# Patient Record
Sex: Female | Born: 1975 | Race: Black or African American | Hispanic: No | Marital: Married | Smoking: Current some day smoker
Health system: Southern US, Community
[De-identification: ages and names within clinical notes are randomized; demographics above are authoritative.]

## PROBLEM LIST (undated history)

## (undated) DIAGNOSIS — K289 Gastrojejunal ulcer, unspecified as acute or chronic, without hemorrhage or perforation: Secondary | ICD-10-CM

## (undated) DIAGNOSIS — K5900 Constipation, unspecified: Secondary | ICD-10-CM

## (undated) DIAGNOSIS — D573 Sickle-cell trait: Secondary | ICD-10-CM

## (undated) HISTORY — PX: TUBAL LIGATION: SHX77

## (undated) HISTORY — PX: NOVASURE ABLATION: SHX5394

---

## 2014-02-08 ENCOUNTER — Emergency Department (HOSPITAL_BASED_OUTPATIENT_CLINIC_OR_DEPARTMENT_OTHER)
Admission: EM | Admit: 2014-02-08 | Discharge: 2014-02-08 | Disposition: A | Discharge status: Home / Self Care | Payer: Self-pay | Attending: Emergency Medicine | Admitting: Emergency Medicine

## 2014-02-08 ENCOUNTER — Encounter (HOSPITAL_BASED_OUTPATIENT_CLINIC_OR_DEPARTMENT_OTHER): Payer: Self-pay | Admitting: Emergency Medicine

## 2014-02-08 DIAGNOSIS — R1110 Vomiting, unspecified: Secondary | ICD-10-CM | POA: Insufficient documentation

## 2014-02-08 DIAGNOSIS — Z3202 Encounter for pregnancy test, result negative: Secondary | ICD-10-CM | POA: Insufficient documentation

## 2014-02-08 DIAGNOSIS — Z872 Personal history of diseases of the skin and subcutaneous tissue: Secondary | ICD-10-CM | POA: Insufficient documentation

## 2014-02-08 DIAGNOSIS — R109 Unspecified abdominal pain: Secondary | ICD-10-CM

## 2014-02-08 DIAGNOSIS — R1013 Epigastric pain: Secondary | ICD-10-CM | POA: Insufficient documentation

## 2014-02-08 DIAGNOSIS — Z880 Allergy status to penicillin: Secondary | ICD-10-CM | POA: Insufficient documentation

## 2014-02-08 LAB — COMPREHENSIVE METABOLIC PANEL
ALBUMIN: 5 g/dL (ref 3.5–5.2)
ALK PHOS: 76 U/L (ref 39–117)
ALT: 10 U/L (ref 0–35)
AST: 19 U/L (ref 0–37)
BILIRUBIN TOTAL: 0.7 mg/dL (ref 0.3–1.2)
BUN: 9 mg/dL (ref 6–23)
CO2: 22 meq/L (ref 19–32)
CREATININE: 0.7 mg/dL (ref 0.50–1.10)
Calcium: 10.9 mg/dL — ABNORMAL HIGH (ref 8.4–10.5)
Chloride: 100 mEq/L (ref 96–112)
GFR calc Af Amer: >90 mL/min (ref >90)
GLUCOSE: 119 mg/dL — AB (ref 70–99)
POTASSIUM: 3.6 meq/L — AB (ref 3.7–5.3)
Sodium: 141 mEq/L (ref 137–147)
Total Protein: 9.4 g/dL — ABNORMAL HIGH (ref 6.0–8.3)

## 2014-02-08 LAB — CBC WITH DIFFERENTIAL/PLATELET
BASOS ABS: 0 10*3/uL (ref 0.0–0.1)
Basophils Relative: 0 % (ref 0–1)
Eosinophils Absolute: 0 10*3/uL (ref 0.0–0.7)
Eosinophils Relative: 0 % (ref 0–5)
HCT: 41.8 % (ref 36.0–46.0)
Hemoglobin: 15 g/dL (ref 12.0–15.0)
LYMPHS ABS: 1.6 10*3/uL (ref 0.7–4.0)
LYMPHS PCT: 11 % — AB (ref 12–46)
MCH: 32.1 pg (ref 26.0–34.0)
MCHC: 35.9 g/dL (ref 30.0–36.0)
MCV: 89.3 fL (ref 78.0–100.0)
Monocytes Absolute: 0.6 10*3/uL (ref 0.1–1.0)
Monocytes Relative: 4 % (ref 3–12)
Neutro Abs: 12.1 10*3/uL — ABNORMAL HIGH (ref 1.7–7.7)
Neutrophils Relative %: 85 % — ABNORMAL HIGH (ref 43–77)
PLATELETS: 430 10*3/uL — AB (ref 150–400)
RBC: 4.68 MIL/uL (ref 3.87–5.11)
RDW: 12.3 % (ref 11.5–15.5)
WBC: 14.3 10*3/uL — AB (ref 4.0–10.5)

## 2014-02-08 LAB — URINALYSIS, ROUTINE W REFLEX MICROSCOPIC
BILIRUBIN URINE: NEGATIVE
Glucose, UA: NEGATIVE mg/dL
HGB URINE DIPSTICK: NEGATIVE
Ketones, ur: >80 mg/dL — AB
LEUKOCYTES UA: NEGATIVE
Nitrite: NEGATIVE
PH: 8.5 — AB (ref 5.0–8.0)
PROTEIN: 30 mg/dL — AB
Specific Gravity, Urine: 1.015 (ref 1.005–1.030)
Urobilinogen, UA: 0.2 mg/dL (ref 0.0–1.0)

## 2014-02-08 LAB — LIPASE, BLOOD: LIPASE: 24 U/L (ref 11–59)

## 2014-02-08 LAB — URINE MICROSCOPIC-ADD ON

## 2014-02-08 LAB — PREGNANCY, URINE: Preg Test, Ur: NEGATIVE

## 2014-02-08 MED ORDER — ONDANSETRON HCL 4 MG/2ML IJ SOLN
4.0000 mg | Freq: Once | INTRAMUSCULAR | Status: AC
  Administered 2014-02-08: 4 mg via INTRAVENOUS
  Filled 2014-02-08: qty 2

## 2014-02-08 MED ORDER — PROMETHAZINE HCL 25 MG/ML IJ SOLN
25.0000 mg | Freq: Once | INTRAMUSCULAR | Status: AC
  Administered 2014-02-08: 25 mg via INTRAVENOUS
  Filled 2014-02-08: qty 1

## 2014-02-08 MED ORDER — ONDANSETRON 4 MG PO TBDP: 4.0000 mg | ORAL_TABLET | Freq: Three times a day (TID) | ORAL | Status: DC | PRN

## 2014-02-08 MED ORDER — PANTOPRAZOLE SODIUM 40 MG IV SOLR
40.0000 mg | Freq: Once | INTRAVENOUS | Status: AC
  Administered 2014-02-08: 40 mg via INTRAVENOUS
  Filled 2014-02-08: qty 40

## 2014-02-08 MED ORDER — MORPHINE SULFATE 4 MG/ML IJ SOLN
4.0000 mg | Freq: Once | INTRAMUSCULAR | Status: AC
  Administered 2014-02-08: 4 mg via INTRAVENOUS
  Filled 2014-02-08: qty 1

## 2014-02-08 MED ORDER — SODIUM CHLORIDE 0.9 % IV BOLUS (SEPSIS)
1000.0000 mL | Freq: Once | INTRAVENOUS | Status: AC
  Administered 2014-02-08: 1000 mL via INTRAVENOUS

## 2014-02-08 MED ORDER — HYDROCODONE-ACETAMINOPHEN 5-325 MG PO TABS: 1.0000 | ORAL_TABLET | ORAL | Status: DC | PRN

## 2014-02-08 MED ORDER — PROMETHAZINE HCL 25 MG PO TABS: 25.0000 mg | ORAL_TABLET | Freq: Four times a day (QID) | ORAL | Status: DC | PRN

## 2014-02-08 NOTE — Discharge Instructions (Signed)

## 2014-02-08 NOTE — ED Provider Notes (Signed)
CSN: [id number]     Arrival date & time 02/08/14  1407 History   First MD Initiated Contact with Patient 02/08/14 1429     Chief Complaint  Patient presents with  . Abdominal Pain     (Consider location/radiation/quality/duration/timing/severity/associated sxs/prior Treatment) HPI Comments: Pt c/o epigastric abdominal pain and vomiting since last night. Pt denies fever or diarrhea. Has taken prevacid for her ulcer. Pt states that she has done this before recently and had  A ct scan and they didn't find anything.  The history is provided by the patient. No language interpreter was used.    History reviewed. No pertinent past medical history. History reviewed. No pertinent past surgical history. No family history on file. History  Substance Use Topics  . Smoking status: Never Smoker   . Smokeless tobacco: Not on file  . Alcohol Use: No   OB History   Grav Para Term Preterm Abortions TAB SAB Ect Mult Living                 Review of Systems  Constitutional: Negative.   Respiratory: Negative.   Cardiovascular: Negative.       Allergies  Penicillins  Home Medications  No current outpatient prescriptions on file. BP 131/80  Pulse 73  Temp(Src) 98.1 F (36.7 C) (Oral)  Resp 20  Ht 5\' 5"  (1.651 m)  Wt 164 lb (74.39 kg)  BMI 27.29 kg/m2  SpO2 100%  LMP 01/23/2014 Physical Exam  Nursing note and vitals reviewed. Constitutional: She is oriented to person, place, and time. She appears well-developed and well-nourished.  Cardiovascular: Normal rate and regular rhythm.   Pulmonary/Chest: Effort normal and breath sounds normal.  Abdominal: Soft. Bowel sounds are normal. There is no tenderness.  Musculoskeletal: Normal range of motion.  Neurological: She is alert and oriented to person, place, and time.  Skin: Skin is warm and dry.  Psychiatric: She has a normal mood and affect.    ED Course  Procedures (including critical care time) Labs Review Labs Reviewed   URINALYSIS, ROUTINE W REFLEX MICROSCOPIC - Abnormal; Notable for the following:    pH 8.5 (*)    Ketones, ur >80 (*)    Protein, ur 30 (*)    All other components within normal limits  COMPREHENSIVE METABOLIC PANEL - Abnormal; Notable for the following:    Potassium 3.6 (*)    Glucose, Bld 119 (*)    Calcium 10.9 (*)    Total Protein 9.4 (*)    All other components within normal limits  CBC WITH DIFFERENTIAL - Abnormal; Notable for the following:    WBC 14.3 (*)    Platelets 430 (*)    Neutrophils Relative % 85 (*)    Lymphocytes Relative 11 (*)    Neutro Abs 12.1 (*)    All other components within normal limits  PREGNANCY, URINE  LIPASE, BLOOD  URINE MICROSCOPIC-ADD ON   Imaging Review No results found.   EKG Interpretation None      MDM   Final diagnoses:  Abdominal pain  Vomiting    Likely gastritis. Don't think any imaging is needed at this time:pt is okay to tolerate po at this time:will send home with antiemetic and hydrocodone    Glendell Docker, NP 02/08/14 1638

## 2014-02-08 NOTE — ED Notes (Signed)
Abdominal pain and vomiting since last night.

## 2014-02-09 NOTE — ED Provider Notes (Signed)
Medical screening examination/treatment/procedure(s) were performed by non-physician practitioner and as supervising physician I was immediately available for consultation/collaboration.  Leota Jacobsen, MD 02/09/14 [phone]

## 2015-01-19 ENCOUNTER — Encounter (HOSPITAL_COMMUNITY): Payer: Self-pay | Admitting: Emergency Medicine

## 2015-01-19 ENCOUNTER — Emergency Department (HOSPITAL_COMMUNITY): Payer: Self-pay

## 2015-01-19 ENCOUNTER — Emergency Department (HOSPITAL_COMMUNITY)
Admission: EM | Admit: 2015-01-19 | Discharge: 2015-01-20 | Disposition: A | Discharge status: Home / Self Care | Payer: Self-pay | Attending: Emergency Medicine | Admitting: Emergency Medicine

## 2015-01-19 DIAGNOSIS — M545 Low back pain: Secondary | ICD-10-CM | POA: Insufficient documentation

## 2015-01-19 DIAGNOSIS — Z880 Allergy status to penicillin: Secondary | ICD-10-CM | POA: Insufficient documentation

## 2015-01-19 DIAGNOSIS — R630 Anorexia: Secondary | ICD-10-CM | POA: Insufficient documentation

## 2015-01-19 DIAGNOSIS — Z79899 Other long term (current) drug therapy: Secondary | ICD-10-CM | POA: Insufficient documentation

## 2015-01-19 DIAGNOSIS — R112 Nausea with vomiting, unspecified: Secondary | ICD-10-CM

## 2015-01-19 DIAGNOSIS — Z3202 Encounter for pregnancy test, result negative: Secondary | ICD-10-CM | POA: Insufficient documentation

## 2015-01-19 DIAGNOSIS — K5900 Constipation, unspecified: Secondary | ICD-10-CM | POA: Insufficient documentation

## 2015-01-19 DIAGNOSIS — R109 Unspecified abdominal pain: Secondary | ICD-10-CM

## 2015-01-19 DIAGNOSIS — R509 Fever, unspecified: Secondary | ICD-10-CM | POA: Insufficient documentation

## 2015-01-19 DIAGNOSIS — R1084 Generalized abdominal pain: Secondary | ICD-10-CM

## 2015-01-19 DIAGNOSIS — M791 Myalgia: Secondary | ICD-10-CM | POA: Insufficient documentation

## 2015-01-19 LAB — CBC WITH DIFFERENTIAL/PLATELET
BASOS ABS: 0 10*3/uL (ref 0.0–0.1)
Basophils Relative: 0 % (ref 0–1)
EOS ABS: 0 10*3/uL (ref 0.0–0.7)
EOS PCT: 0 % (ref 0–5)
HCT: 43.7 % (ref 36.0–46.0)
Hemoglobin: 15.1 g/dL — ABNORMAL HIGH (ref 12.0–15.0)
Lymphocytes Relative: 12 % (ref 12–46)
Lymphs Abs: 1.3 10*3/uL (ref 0.7–4.0)
MCH: 31.4 pg (ref 26.0–34.0)
MCHC: 34.6 g/dL (ref 30.0–36.0)
MCV: 90.9 fL (ref 78.0–100.0)
Monocytes Absolute: 0.4 10*3/uL (ref 0.1–1.0)
Monocytes Relative: 4 % (ref 3–12)
NEUTROS PCT: 84 % — AB (ref 43–77)
Neutro Abs: 9.5 10*3/uL — ABNORMAL HIGH (ref 1.7–7.7)
PLATELETS: 361 10*3/uL (ref 150–400)
RBC: 4.81 MIL/uL (ref 3.87–5.11)
RDW: 13.3 % (ref 11.5–15.5)
WBC: 11.3 10*3/uL — AB (ref 4.0–10.5)

## 2015-01-19 LAB — URINALYSIS, ROUTINE W REFLEX MICROSCOPIC
Bilirubin Urine: NEGATIVE
GLUCOSE, UA: NEGATIVE mg/dL
Hgb urine dipstick: NEGATIVE
KETONES UR: 40 mg/dL — AB
Leukocytes, UA: NEGATIVE
NITRITE: NEGATIVE
PH: 8.5 — AB (ref 5.0–8.0)
Protein, ur: 30 mg/dL — AB
SPECIFIC GRAVITY, URINE: 1.02 (ref 1.005–1.030)
Urobilinogen, UA: 0.2 mg/dL (ref 0.0–1.0)

## 2015-01-19 LAB — COMPREHENSIVE METABOLIC PANEL
ALT: 14 U/L (ref 0–35)
AST: 21 U/L (ref 0–37)
Albumin: 5.2 g/dL (ref 3.5–5.2)
Alkaline Phosphatase: 67 U/L (ref 39–117)
Anion gap: 12 (ref 5–15)
BILIRUBIN TOTAL: 1 mg/dL (ref 0.3–1.2)
BUN: 11 mg/dL (ref 6–23)
CALCIUM: 10.5 mg/dL (ref 8.4–10.5)
CHLORIDE: 105 mmol/L (ref 96–112)
CO2: 26 mmol/L (ref 19–32)
Creatinine, Ser: 0.76 mg/dL (ref 0.50–1.10)
GFR calc non Af Amer: >90 mL/min (ref >90)
GLUCOSE: 124 mg/dL — AB (ref 70–99)
Potassium: 3.6 mmol/L (ref 3.5–5.1)
Sodium: 143 mmol/L (ref 135–145)
Total Protein: 9.3 g/dL — ABNORMAL HIGH (ref 6.0–8.3)

## 2015-01-19 LAB — URINE MICROSCOPIC-ADD ON

## 2015-01-19 LAB — LIPASE, BLOOD: LIPASE: 20 U/L (ref 11–59)

## 2015-01-19 LAB — POC URINE PREG, ED: Preg Test, Ur: NEGATIVE

## 2015-01-19 MED ORDER — MORPHINE SULFATE 4 MG/ML IJ SOLN
4.0000 mg | Freq: Once | INTRAMUSCULAR | Status: AC
  Administered 2015-01-19: 4 mg via INTRAVENOUS
  Filled 2015-01-19: qty 1

## 2015-01-19 MED ORDER — DICYCLOMINE HCL 10 MG/ML IM SOLN
20.0000 mg | Freq: Once | INTRAMUSCULAR | Status: AC
  Administered 2015-01-19: 20 mg via INTRAMUSCULAR
  Filled 2015-01-19: qty 2

## 2015-01-19 MED ORDER — MAGNESIUM CITRATE PO SOLN
1.0000 | Freq: Once | ORAL | Status: AC
  Administered 2015-01-19: 1 via ORAL
  Filled 2015-01-19: qty 296

## 2015-01-19 MED ORDER — SODIUM CHLORIDE 0.9 % IV BOLUS (SEPSIS)
1000.0000 mL | Freq: Once | INTRAVENOUS | Status: AC
  Administered 2015-01-19: 1000 mL via INTRAVENOUS

## 2015-01-19 MED ORDER — ONDANSETRON HCL 4 MG/2ML IJ SOLN
4.0000 mg | Freq: Once | INTRAMUSCULAR | Status: AC
  Administered 2015-01-19: 4 mg via INTRAVENOUS
  Filled 2015-01-19: qty 2

## 2015-01-19 MED ORDER — FLEET ENEMA 7-19 GM/118ML RE ENEM
1.0000 | ENEMA | Freq: Once | RECTAL | Status: AC
  Administered 2015-01-19: 1 via RECTAL
  Filled 2015-01-19: qty 1

## 2015-01-19 MED ORDER — PROMETHAZINE HCL 25 MG/ML IJ SOLN
25.0000 mg | Freq: Once | INTRAMUSCULAR | Status: AC
  Administered 2015-01-19: 25 mg via INTRAVENOUS
  Filled 2015-01-19 (×2): qty 1

## 2015-01-19 MED ORDER — PROMETHAZINE HCL 25 MG/ML IJ SOLN
25.0000 mg | Freq: Once | INTRAMUSCULAR | Status: AC
  Administered 2015-01-19: 25 mg via INTRAVENOUS
  Filled 2015-01-19: qty 1

## 2015-01-19 NOTE — ED Notes (Signed)
Pt on bedside commode, states she continues to feel like she has to have a BM but is unable to go.

## 2015-01-19 NOTE — ED Notes (Signed)
Pt has been vomiting all day, not able to keep anything dow, has not has BM in about 3 days. Pt c/o abd pain

## 2015-01-19 NOTE — ED Provider Notes (Signed)
CSN: [id number]     Arrival date & time 01/19/15  1815 History   First MD Initiated Contact with Patient 01/19/15 1844     Chief Complaint  Patient presents with  . Vomiting     (Consider location/radiation/quality/duration/timing/severity/associated sxs/prior Treatment) HPI Pt is a 39yo female with hx of renal stones and constipation, presenting to ED with c/o gradually worsening abdominal pain with associated nausea and vomiting.  Reports having 15-20 episodes of vomiting with scant red blood in emesis.  Abdominal pain is 10/10, nothing makes better or worse. States she has not had a BM for 3 days.  Reports taking miralax w/o relief.  Reports having an EGD a few months ago and was told there was "scar tissue" but was only told to take miralax.  Denies hx of SBO but concerned she is "backed up". Abdominal hx significant for c-section. Denies urinary or vaginal symptoms.   History reviewed. No pertinent past medical history. History reviewed. No pertinent past surgical history. No family history on file. History  Substance Use Topics  . Smoking status: Never Smoker   . Smokeless tobacco: Not on file  . Alcohol Use: No   OB History    No data available     Review of Systems  Constitutional: Positive for fever, chills and appetite change. Negative for diaphoresis and fatigue.  Respiratory: Negative for cough and shortness of breath.   Cardiovascular: Negative for chest pain and palpitations.  Gastrointestinal: Positive for nausea, vomiting, abdominal pain ( diffuse) and constipation. Negative for diarrhea.  Genitourinary: Positive for flank pain ( bilateral). Negative for dysuria, urgency, frequency, hematuria, decreased urine volume, vaginal bleeding, vaginal discharge, vaginal pain and pelvic pain.  Musculoskeletal: Positive for myalgias and back pain ( bilateral lower). Negative for neck pain and neck stiffness.  All other systems reviewed and are negative.     Allergies   Penicillins  Home Medications   Prior to Admission medications   Medication Sig Start Date End Date Taking? Authorizing Provider  dicyclomine (BENTYL) 20 MG tablet Take 1 tablet (20 mg total) by mouth 2 (two) times daily. 01/20/15   Noland Fordyce, PA-C  HYDROcodone-acetaminophen (NORCO/VICODIN) 5-325 MG per tablet Take 1-2 tablets by mouth every 4 (four) hours as needed. Patient not taking: Reported on 01/19/2015 02/08/14   Glendell Docker, NP  ondansetron (ZOFRAN ODT) 4 MG disintegrating tablet Take 1 tablet (4 mg total) by mouth every 8 (eight) hours as needed for nausea or vomiting. Patient not taking: Reported on 01/19/2015 02/08/14   Glendell Docker, NP  promethazine (PHENERGAN) 25 MG tablet Take 1 tablet (25 mg total) by mouth every 6 (six) hours as needed for nausea or vomiting. Patient not taking: Reported on 01/19/2015 02/08/14   Glendell Docker, NP  promethazine (PHENERGAN) 25 MG tablet Take 1 tablet (25 mg total) by mouth every 6 (six) hours as needed for nausea or vomiting. 01/20/15   Noland Fordyce, PA-C   BP 131/76 mmHg  Pulse 65  Temp(Src) 97.8 F (36.6 C) (Oral)  Resp 18  SpO2 100%  LMP 12/18/2014 (Approximate) Physical Exam  Constitutional: She appears well-developed and well-nourished. She appears distressed.  Pt lying in exam bed on left side, curled up under blankets, tearful, active vomiting during exam.  HENT:  Head: Normocephalic and atraumatic.  Eyes: Conjunctivae are normal. No scleral icterus.  Neck: Normal range of motion.  Cardiovascular: Normal rate, regular rhythm and normal heart sounds.   Pulmonary/Chest: Effort normal and breath sounds normal. No respiratory distress.  She has no wheezes. She has no rales. She exhibits no tenderness.  Abdominal: Soft. Bowel sounds are normal. She exhibits no distension and no mass. There is tenderness. There is no rebound and no guarding.  Soft, non-distended. diffuse tenderness w/o guarding. Bilateral CVAT vs muscular  pain.  Genitourinary:  Chaperoned exam. Rectal exam: no external or internal hemorrhoids. Normal sphincter tone. No fecal impaction.   Musculoskeletal: Normal range of motion.  Neurological: She is alert.  Skin: Skin is warm and dry. She is not diaphoretic.  Nursing note and vitals reviewed.   ED Course  Procedures (including critical care time) Labs Review Labs Reviewed  CBC WITH DIFFERENTIAL/PLATELET - Abnormal; Notable for the following:    WBC 11.3 (*)    Hemoglobin 15.1 (*)    Neutrophils Relative % 84 (*)    Neutro Abs 9.5 (*)    All other components within normal limits  COMPREHENSIVE METABOLIC PANEL - Abnormal; Notable for the following:    Glucose, Bld 124 (*)    Total Protein 9.3 (*)    All other components within normal limits  URINALYSIS, ROUTINE W REFLEX MICROSCOPIC - Abnormal; Notable for the following:    pH 8.5 (*)    Ketones, ur 40 (*)    Protein, ur 30 (*)    All other components within normal limits  LIPASE, BLOOD  URINE MICROSCOPIC-ADD ON  POC URINE PREG, ED    Imaging Review Ct Renal Stone Study  01/19/2015   CLINICAL DATA:  Dysuria, low abdominal pain, nausea and vomiting.  EXAM: CT ABDOMEN AND PELVIS WITHOUT CONTRAST  TECHNIQUE: Multidetector CT imaging of the abdomen and pelvis was performed following the standard protocol without IV contrast.  COMPARISON:  10/30/2014  FINDINGS: The lung bases are clear.  Kidneys are symmetrical. No hydronephrosis or hydroureter. No renal, ureteral, or bladder stones identified. No bladder wall thickening.  Unenhanced appearance of the liver, spleen, gallbladder, pancreas, adrenal glands, abdominal aorta, inferior vena cava, and retroperitoneal lymph nodes is unremarkable. Stomach, small bowel, and colon are decompressed. No free air or free fluid in the abdomen.  Pelvis: The appendix is normal. Uterus and ovaries are not enlarged. Calcified phleboliths. Gas and fullness in the vagina, possible tampon. No pelvic mass or  lymphadenopathy. No free or loculated pelvic fluid collections. No destructive bone lesions.  IMPRESSION: No renal or ureteral stone or obstruction.   Electronically Signed   By: Lucienne Capers M.D.   On: 01/19/2015 21:30     EKG Interpretation None      MDM   Final diagnoses:  Generalized abdominal pain  Abdominal cramping  Constipation, unspecified constipation type  Non-intractable vomiting with nausea, vomiting of unspecified type    Pt with hx of renal stones and constipation presenting to ED with severe abdominal pain, nausea and vomiting.   Pt denies urinary symptoms but c/o bilateral flank pain, lower back pain and diffuse abdominal pain. Unable to keep down fluids. No BM for 3 days.  Abdomen is soft, non-distended with diffuse tenderness and bilateral CVAT vs muscular pain.  No fecal impaction.  Labs: UA-protein and ketones present, otherwise, unremarkable.  CT abd: no renal or ureteral stone or obstruction. No free air or free fluid in abdomen.  Will tx for constipation. FLEET enema ordered in ED.   10:01 PM Discussed labs and imaging with pt. Pt sitting on exam bed with knees to her chest, tremoring, appears uncomfortable.  FLEET enema still pending, pt states "give me whatever will make  me go (have a BM) now."   11:32 PM Pt has only had 1 small BM. States she has only tried 1 sip of magnesium citrate as it made her vomit.  Will give a second dose of phenergan.  Discussed pt with Dr. Maryan Rued, will give pt IM bentyl, believe pt has IBS as she has recurrent similar episodes every week.  12:20 AM Pt resting comfortably in exam bed. States she feels comfortable being discharged home, would like to be discharged so she can sleep at home in her bed.  Pt hemodynamically stable for discharge home. Rx: bentyl and phenergan. Home care instructions provided. Advised to f/u with PCP and GI for recheck of symptoms next week. Return precautions provided. Pt verbalized understanding and  agreement with tx plan.     Noland Fordyce, PA-C 01/20/15 0022  Blanchie Dessert, MD 01/20/15 [phone]

## 2015-01-19 NOTE — ED Notes (Signed)
Pt unable to give urine sample at this time 

## 2015-01-20 MED ORDER — PROMETHAZINE HCL 25 MG PO TABS: 25.0000 mg | ORAL_TABLET | Freq: Four times a day (QID) | ORAL | Status: DC | PRN

## 2015-01-20 MED ORDER — DICYCLOMINE HCL 20 MG PO TABS: 20.0000 mg | ORAL_TABLET | Freq: Two times a day (BID) | ORAL | Status: DC

## 2015-04-01 ENCOUNTER — Emergency Department (HOSPITAL_BASED_OUTPATIENT_CLINIC_OR_DEPARTMENT_OTHER): Payer: Self-pay

## 2015-04-01 ENCOUNTER — Emergency Department (HOSPITAL_BASED_OUTPATIENT_CLINIC_OR_DEPARTMENT_OTHER)
Admission: EM | Admit: 2015-04-01 | Discharge: 2015-04-01 | Disposition: A | Discharge status: Home / Self Care | Payer: Self-pay | Attending: Emergency Medicine | Admitting: Emergency Medicine

## 2015-04-01 ENCOUNTER — Encounter (HOSPITAL_BASED_OUTPATIENT_CLINIC_OR_DEPARTMENT_OTHER): Payer: Self-pay

## 2015-04-01 DIAGNOSIS — R112 Nausea with vomiting, unspecified: Secondary | ICD-10-CM | POA: Insufficient documentation

## 2015-04-01 DIAGNOSIS — Z3202 Encounter for pregnancy test, result negative: Secondary | ICD-10-CM | POA: Insufficient documentation

## 2015-04-01 DIAGNOSIS — Z79899 Other long term (current) drug therapy: Secondary | ICD-10-CM | POA: Insufficient documentation

## 2015-04-01 DIAGNOSIS — Z880 Allergy status to penicillin: Secondary | ICD-10-CM | POA: Insufficient documentation

## 2015-04-01 DIAGNOSIS — Z8711 Personal history of peptic ulcer disease: Secondary | ICD-10-CM | POA: Insufficient documentation

## 2015-04-01 DIAGNOSIS — R1013 Epigastric pain: Secondary | ICD-10-CM | POA: Insufficient documentation

## 2015-04-01 HISTORY — DX: Gastrojejunal ulcer, unspecified as acute or chronic, without hemorrhage or perforation: K28.9

## 2015-04-01 LAB — COMPREHENSIVE METABOLIC PANEL
ALT: 11 U/L — AB (ref 14–54)
AST: 16 U/L (ref 15–41)
Albumin: 4.3 g/dL (ref 3.5–5.0)
Alkaline Phosphatase: 54 U/L (ref 38–126)
Anion gap: 10 (ref 5–15)
BILIRUBIN TOTAL: 0.6 mg/dL (ref 0.3–1.2)
BUN: 10 mg/dL (ref 6–20)
CHLORIDE: 104 mmol/L (ref 101–111)
CO2: 26 mmol/L (ref 22–32)
Calcium: 9.4 mg/dL (ref 8.9–10.3)
Creatinine, Ser: 0.83 mg/dL (ref 0.44–1.00)
GLUCOSE: 115 mg/dL — AB (ref 70–99)
POTASSIUM: 3.6 mmol/L (ref 3.5–5.1)
SODIUM: 140 mmol/L (ref 135–145)
Total Protein: 7.5 g/dL (ref 6.5–8.1)

## 2015-04-01 LAB — CBC WITH DIFFERENTIAL/PLATELET
BASOS ABS: 0 10*3/uL (ref 0.0–0.1)
BASOS PCT: 0 % (ref 0–1)
Eosinophils Absolute: 0 10*3/uL (ref 0.0–0.7)
Eosinophils Relative: 0 % (ref 0–5)
HCT: 38.2 % (ref 36.0–46.0)
Hemoglobin: 13.5 g/dL (ref 12.0–15.0)
LYMPHS PCT: 13 % (ref 12–46)
Lymphs Abs: 1.3 10*3/uL (ref 0.7–4.0)
MCH: 31.6 pg (ref 26.0–34.0)
MCHC: 35.3 g/dL (ref 30.0–36.0)
MCV: 89.5 fL (ref 78.0–100.0)
MONO ABS: 0.7 10*3/uL (ref 0.1–1.0)
Monocytes Relative: 7 % (ref 3–12)
NEUTROS ABS: 8.2 10*3/uL — AB (ref 1.7–7.7)
Neutrophils Relative %: 80 % — ABNORMAL HIGH (ref 43–77)
Platelets: 373 10*3/uL (ref 150–400)
RBC: 4.27 MIL/uL (ref 3.87–5.11)
RDW: 13.1 % (ref 11.5–15.5)
WBC: 10.1 10*3/uL (ref 4.0–10.5)

## 2015-04-01 LAB — URINALYSIS, ROUTINE W REFLEX MICROSCOPIC
Bilirubin Urine: NEGATIVE
GLUCOSE, UA: NEGATIVE mg/dL
Hgb urine dipstick: NEGATIVE
KETONES UR: 15 mg/dL — AB
Leukocytes, UA: NEGATIVE
Nitrite: NEGATIVE
PH: 8 (ref 5.0–8.0)
Protein, ur: NEGATIVE mg/dL
Specific Gravity, Urine: 1.012 (ref 1.005–1.030)
Urobilinogen, UA: 0.2 mg/dL (ref 0.0–1.0)

## 2015-04-01 LAB — TROPONIN I: Troponin I: <0.03 ng/mL (ref <0.031)

## 2015-04-01 LAB — LIPASE, BLOOD: LIPASE: 32 U/L (ref 22–51)

## 2015-04-01 LAB — PREGNANCY, URINE: Preg Test, Ur: NEGATIVE

## 2015-04-01 MED ORDER — PROMETHAZINE HCL 25 MG PO TABS: 25.0000 mg | ORAL_TABLET | Freq: Four times a day (QID) | ORAL | Status: DC | PRN

## 2015-04-01 MED ORDER — GI COCKTAIL ~~LOC~~
30.0000 mL | Freq: Once | ORAL | Status: AC
  Administered 2015-04-01: 30 mL via ORAL
  Filled 2015-04-01: qty 30

## 2015-04-01 MED ORDER — ONDANSETRON HCL 4 MG/2ML IJ SOLN
4.0000 mg | Freq: Once | INTRAMUSCULAR | Status: AC
  Administered 2015-04-01: 4 mg via INTRAVENOUS
  Filled 2015-04-01: qty 2

## 2015-04-01 MED ORDER — SODIUM CHLORIDE 0.9 % IV BOLUS (SEPSIS)
1000.0000 mL | Freq: Once | INTRAVENOUS | Status: AC
  Administered 2015-04-01: 1000 mL via INTRAVENOUS

## 2015-04-01 MED ORDER — HYDROMORPHONE HCL 1 MG/ML IJ SOLN
1.0000 mg | Freq: Once | INTRAMUSCULAR | Status: AC
  Administered 2015-04-01: 1 mg via INTRAVENOUS
  Filled 2015-04-01: qty 1

## 2015-04-01 MED ORDER — PANTOPRAZOLE SODIUM 40 MG PO TBEC
40.0000 mg | DELAYED_RELEASE_TABLET | Freq: Once | ORAL | Status: AC
  Administered 2015-04-01: 40 mg via ORAL
  Filled 2015-04-01: qty 1

## 2015-04-01 MED ORDER — OMEPRAZOLE 40 MG PO CPDR: 40.0000 mg | DELAYED_RELEASE_CAPSULE | Freq: Every day | ORAL | Status: DC

## 2015-04-01 MED ORDER — SUCRALFATE 1 G PO TABS: 1.0000 g | ORAL_TABLET | Freq: Three times a day (TID) | ORAL | Status: DC

## 2015-04-01 MED ORDER — ONDANSETRON HCL 4 MG/2ML IJ SOLN: 4.0000 mg | Freq: Once | INTRAMUSCULAR | Status: AC

## 2015-04-01 MED ORDER — IOHEXOL 300 MG/ML  SOLN
100.0000 mL | Freq: Once | INTRAMUSCULAR | Status: AC | PRN
  Administered 2015-04-01: 100 mL via INTRAVENOUS

## 2015-04-01 MED ORDER — SODIUM CHLORIDE 0.9 % IV SOLN
INTRAVENOUS | Status: DC
  Administered 2015-04-01: 14:00:00 via INTRAVENOUS

## 2015-04-01 MED ORDER — HYDROCODONE-ACETAMINOPHEN 5-325 MG PO TABS: 1.0000 | ORAL_TABLET | ORAL | Status: DC | PRN

## 2015-04-01 MED ORDER — IOHEXOL 300 MG/ML  SOLN
50.0000 mL | Freq: Once | INTRAMUSCULAR | Status: AC | PRN
  Administered 2015-04-01: 50 mL via ORAL

## 2015-04-01 MED ORDER — PROMETHAZINE HCL 25 MG/ML IJ SOLN
25.0000 mg | Freq: Once | INTRAMUSCULAR | Status: AC
  Administered 2015-04-01: 25 mg via INTRAVENOUS
  Filled 2015-04-01: qty 1

## 2015-04-01 MED ORDER — ONDANSETRON HCL 4 MG/2ML IJ SOLN
4.0000 mg | Freq: Once | INTRAMUSCULAR | Status: AC
  Administered 2015-04-01: 4 mg via INTRAVENOUS

## 2015-04-01 MED ORDER — ONDANSETRON HCL 4 MG/2ML IJ SOLN
INTRAMUSCULAR | Status: AC
  Filled 2015-04-01: qty 2

## 2015-04-01 NOTE — Discharge Instructions (Signed)
Please continue your Prilosec every day. We have given you refill for this medication. Please make an appointment to see her gastroenterologist. Please avoid NSAIDs such as ibuprofen, aspirin, Aleve. Please also avoid alcohol.   Gastritis, Adult Gastritis is soreness and swelling (inflammation) of the lining of the stomach. Gastritis can develop as a sudden onset (acute) or long-term (chronic) condition. If gastritis is not treated, it can lead to stomach bleeding and ulcers. CAUSES  Gastritis occurs when the stomach lining is weak or damaged. Digestive juices from the stomach then inflame the weakened stomach lining. The stomach lining may be weak or damaged due to viral or bacterial infections. One common bacterial infection is the Helicobacter pylori infection. Gastritis can also result from excessive alcohol consumption, taking certain medicines, or having too much acid in the stomach.  SYMPTOMS  In some cases, there are no symptoms. When symptoms are present, they may include:  Pain or a burning sensation in the upper abdomen.  Nausea.  Vomiting.  An uncomfortable feeling of fullness after eating. DIAGNOSIS  Your caregiver may suspect you have gastritis based on your symptoms and a physical exam. To determine the cause of your gastritis, your caregiver may perform the following:  Blood or stool tests to check for the H pylori bacterium.  Gastroscopy. A thin, flexible tube (endoscope) is passed down the esophagus and into the stomach. The endoscope has a light and camera on the end. Your caregiver uses the endoscope to view the inside of the stomach.  Taking a tissue sample (biopsy) from the stomach to examine under a microscope. TREATMENT  Depending on the cause of your gastritis, medicines may be prescribed. If you have a bacterial infection, such as an H pylori infection, antibiotics may be given. If your gastritis is caused by too much acid in the stomach, H2 blockers or antacids  may be given. Your caregiver may recommend that you stop taking aspirin, ibuprofen, or other nonsteroidal anti-inflammatory drugs (NSAIDs). HOME CARE INSTRUCTIONS  Only take over-the-counter or prescription medicines as directed by your caregiver.  If you were given antibiotic medicines, take them as directed. Finish them even if you start to feel better.  Drink enough fluids to keep your urine clear or pale yellow.  Avoid foods and drinks that make your symptoms worse, such as:  Caffeine or alcoholic drinks.  Chocolate.  Peppermint or mint flavorings.  Garlic and onions.  Spicy foods.  Citrus fruits, such as oranges, lemons, or limes.  Tomato-based foods such as sauce, chili, salsa, and pizza.  Fried and fatty foods.  Eat small, frequent meals instead of large meals. SEEK IMMEDIATE MEDICAL CARE IF:   You have black or dark red stools.  You vomit blood or material that looks like coffee grounds.  You are unable to keep fluids down.  Your abdominal pain gets worse.  You have a fever.  You do not feel better after 1 week.  You have any other questions or concerns. MAKE SURE YOU:  Understand these instructions.  Will watch your condition.  Will get help right away if you are not doing well or get worse. Document Released: 11/06/2001 Document Revised: 05/13/2012 Document Reviewed: 12/26/2011 [facility] Patient Information 2015 [place], [place]. This information is not intended to replace advice given to you by your health care provider. Make sure you discuss any questions you have with your health care provider.   Possible Peptic Ulcer A peptic ulcer is a sore in the lining of your esophagus (esophageal ulcer),  stomach (gastric ulcer), or in the first part of your small intestine (duodenal ulcer). The ulcer causes erosion into the deeper tissue. CAUSES  Normally, the lining of the stomach and the small intestine protects itself from the acid that digests food. The  protective lining can be damaged by:  An infection caused by a bacterium called Helicobacter pylori (H. pylori).  Regular use of nonsteroidal anti-inflammatory drugs (NSAIDs), such as ibuprofen or aspirin.  Smoking tobacco. Other risk factors include being older than 34, drinking alcohol excessively, and having a family history of ulcer disease.  SYMPTOMS   Burning pain or gnawing in the area between the chest and the belly button.  Heartburn.  Nausea and vomiting.  Bloating. The pain can be worse on an empty stomach and at night. If the ulcer results in bleeding, it can cause:  Black, tarry stools.  Vomiting of bright red blood.  Vomiting of coffee-ground-looking materials. DIAGNOSIS  A diagnosis is usually made based upon your history and an exam. Other tests and procedures may be performed to find the cause of the ulcer. Finding a cause will help determine the best treatment. Tests and procedures may include:  Blood tests, stool tests, or breath tests to check for the bacterium H. pylori.  An upper gastrointestinal (GI) series of the esophagus, stomach, and small intestine.  An endoscopy to examine the esophagus, stomach, and small intestine.  A biopsy. TREATMENT  Treatment may include:  Eliminating the cause of the ulcer, such as smoking, NSAIDs, or alcohol.  Medicines to reduce the amount of acid in your digestive tract.  Antibiotic medicines if the ulcer is caused by the H. pylori bacterium.  An upper endoscopy to treat a bleeding ulcer.  Surgery if the bleeding is severe or if the ulcer created a hole somewhere in the digestive system. HOME CARE INSTRUCTIONS   Avoid tobacco, alcohol, and caffeine. Smoking can increase the acid in the stomach, and continued smoking will impair the healing of ulcers.  Avoid foods and drinks that seem to cause discomfort or aggravate your ulcer.  Only take medicines as directed by your caregiver. Do not substitute  over-the-counter medicines for prescription medicines without talking to your caregiver.  Keep any follow-up appointments and tests as directed. SEEK MEDICAL CARE IF:   Your do not improve within 7 days of starting treatment.  You have ongoing indigestion or heartburn. SEEK IMMEDIATE MEDICAL CARE IF:   You have sudden, sharp, or persistent abdominal pain.  You have bloody or dark black, tarry stools.  You vomit blood or vomit that looks like coffee grounds.  You become light-headed, weak, or feel faint.  You become sweaty or clammy. MAKE SURE YOU:   Understand these instructions.  Will watch your condition.  Will get help right away if you are not doing well or get worse. Document Released: 11/09/2000 Document Revised: 03/29/2014 Document Reviewed: 06/11/2012 [facility] Patient Information 2015 [place], [place]. This information is not intended to replace advice given to you by your health care provider. Make sure you discuss any questions you have with your health care provider.   Food Choices for Peptic Ulcer Disease When you have peptic ulcer disease, the foods you eat and your eating habits are very important. Choosing the right foods can help ease the discomfort of peptic ulcer disease. WHAT GENERAL GUIDELINES DO I NEED TO FOLLOW?  Choose fruits, vegetables, whole grains, and low-fat meat, fish, and poultry.   Keep a food diary to identify foods that cause  symptoms.  Avoid foods that cause irritation or pain. These may be different for different people.  Eat frequent small meals instead of three large meals each day. The pain may be worse when your stomach is empty.  Avoid eating close to bedtime. WHAT FOODS ARE NOT RECOMMENDED? The following are some foods and drinks that may worsen your symptoms:  Black, white, and red pepper.  Hot sauce.  Chili peppers.  Chili powder.  Chocolate and cocoa.   Alcohol.  Tea, coffee, and cola (regular and  decaffeinated). The items listed above may not be a complete list of foods and beverages to avoid. Contact your dietitian for more information. Document Released: 02/04/2012 Document Revised: 11/17/2013 Document Reviewed: 09/16/2013 [facility] Patient Information 2015 [place], [place]. This information is not intended to replace advice given to you by your health care provider. Make sure you discuss any questions you have with your health care provider.

## 2015-04-01 NOTE — ED Notes (Addendum)
Pt reports vomiting since this morning. Pt denies pain, diarrhea. Pt uncooperative during triage assessment, does not want to answer many assessment questions.

## 2015-04-01 NOTE — ED Provider Notes (Addendum)
TIME SEEN: 12:10 PM  CHIEF COMPLAINT: Abdominal pain, vomiting  HPI: Pt is a 39 y.o. female with history of gastric ulcers who presents to the emergency department with epigastric abdominal pain that is sharp without radiation it as severe in nature but feel similar to her prior peptic ulcers. She's also had vomiting. No hematemesis or coffee grounds emesis. No bloody stool or melena. Significant other reports patient has been using NSAIDs recently. Denies alcohol use. Denies sick contacts or recent travel. No fever. No dysuria or hematuria. No vaginal discharge or bleeding. States her last EGD was one year ago. Her gastroenterologist is in [place]. States she is on Prilosec every day. Is status post C-section. Denies chest pain or shortness of breath.  ROS: See HPI Constitutional: no fever  Eyes: no drainage  ENT: no runny nose   Cardiovascular:  no chest pain  Resp: no SOB  GI:  vomiting GU: no dysuria Integumentary: no rash  Allergy: no hives  Musculoskeletal: no leg swelling  Neurological: no slurred speech ROS otherwise negative  PAST MEDICAL HISTORY/PAST SURGICAL HISTORY:  Past Medical History  Diagnosis Date  . Ulcer of the stomach and intestine     MEDICATIONS:  Prior to Admission medications   Medication Sig Start Date End Date Taking? Authorizing Provider  omeprazole (PRILOSEC) 40 MG capsule Take 40 mg by mouth daily.   Yes Historical Provider, MD  dicyclomine (BENTYL) 20 MG tablet Take 1 tablet (20 mg total) by mouth 2 (two) times daily. 01/20/15   Noland Fordyce, PA-C  HYDROcodone-acetaminophen (NORCO/VICODIN) 5-325 MG per tablet Take 1-2 tablets by mouth every 4 (four) hours as needed. Patient not taking: Reported on 01/19/2015 02/08/14   Glendell Docker, NP  ondansetron (ZOFRAN ODT) 4 MG disintegrating tablet Take 1 tablet (4 mg total) by mouth every 8 (eight) hours as needed for nausea or vomiting. Patient not taking: Reported on 01/19/2015 02/08/14   Glendell Docker, NP  promethazine (PHENERGAN) 25 MG tablet Take 1 tablet (25 mg total) by mouth every 6 (six) hours as needed for nausea or vomiting. Patient not taking: Reported on 01/19/2015 02/08/14   Glendell Docker, NP  promethazine (PHENERGAN) 25 MG tablet Take 1 tablet (25 mg total) by mouth every 6 (six) hours as needed for nausea or vomiting. 01/20/15   Noland Fordyce, PA-C    ALLERGIES:  Allergies  Allergen Reactions  . Penicillins     hives    SOCIAL HISTORY:  History  Substance Use Topics  . Smoking status: Never Smoker   . Smokeless tobacco: Not on file  . Alcohol Use: No    FAMILY HISTORY: No family history on file.  EXAM: BP 137/79 mmHg  Pulse 76  Temp(Src) 97.8 F (36.6 C) (Oral)  Resp 16  Ht 5\' 5"  (1.651 m)  Wt 145 lb (65.772 kg)  BMI 24.13 kg/m2  SpO2 100%  LMP 03/25/2015 CONSTITUTIONAL: Alert and oriented and responds appropriately to questions. Appears very uncomfortable, tearful, diaphoretic HEAD: Normocephalic EYES: Conjunctivae clear, PERRL ENT: normal nose; no rhinorrhea; moist mucous membranes; pharynx without lesions noted NECK: Supple, no meningismus, no LAD  CARD: RRR; S1 and S2 appreciated; no murmurs, no clicks, no rubs, no gallops RESP: Normal chest excursion without splinting or tachypnea; breath sounds clear and equal bilaterally; no wheezes, no rhonchi, no rales, no hypoxia or respiratory distress, speaking full sentences ABD/GI: Normal bowel sounds; non-distended; soft, mildly tender to palpation in the epigastric region without guarding or rebound, no peritoneal signs, negative Percell Miller  sign BACK:  The back appears normal and is non-tender to palpation, there is no CVA tenderness EXT: Normal ROM in all joints; non-tender to palpation; no edema; normal capillary refill; no cyanosis, no calf tenderness or swelling    SKIN: Normal color for age and race; warm, diaphoretic NEURO: Moves all extremities equally, sensation to light touch intact  diffusely, cranial nerves II through XII intact PSYCH: The patient's mood and manner are appropriate. Grooming and personal hygiene are appropriate.  MEDICAL DECISION MAKING: Patient here with epigastric pain similar to her prior peptic ulcers. Labs unremarkable including normal LFTs and lipase. Urine shows no sign of infection and pregnancy test is negative. EKG shows no ischemic changes and troponin negative. Abdominal x-ray unremarkable and shows no sign of perforated ulcer. Patient continues to have significant pain and vomiting despite 2 rounds of Zofran. We'll proceed with CT abdomen and pelvis.  ED PROGRESS: CT scan shows no acute abnormality. Normal appendix. No bowel obstruction or free air. Patient given Phenergan and reports feeling much better and is able to tolerate by mouth. Suspect gastritis versus ulcer. We'll discharge her with prescriptions for Phenergan, Vicodin as well as Carafate. Have advised her to continue her Prilosec and follow-up with her gastroenterologist if symptoms continue. Have recommended avoiding NSAIDs, alcohol, spicy/acidic/fatty foods.   She verbalizes understanding and is comfortable with plan.    EKG Interpretation  Date/Time:  Friday Apr 01 2015 12:37:41 EDT Ventricular Rate:  72 PR Interval:  168 QRS Duration: 102 QT Interval:  430 QTC Calculation: 470 R Axis:   80 Text Interpretation:  Normal sinus rhythm Normal ECG No old tracing to compare Confirmed by Ladavia Lindenbaum,  DO, Holland Kotter [phone]) on 04/01/2015 12:49:47 PM        [place], DO 04/01/15 [place], DO 04/01/15 1617

## 2015-04-01 NOTE — ED Notes (Addendum)
Pt requested to use bathroom, gait was unsteady on ambulation so wheelchair was used to transport to bathroom. Pt and husband stated they would be alright and requested privacy. Instructed to seek assistance as needed.

## 2015-05-29 ENCOUNTER — Observation Stay (HOSPITAL_COMMUNITY): Payer: Self-pay

## 2015-05-29 ENCOUNTER — Observation Stay (HOSPITAL_COMMUNITY)
Admission: EM | Admit: 2015-05-29 | Discharge: 2015-05-30 | Disposition: A | Discharge status: Home / Self Care | Payer: Self-pay | Attending: Internal Medicine | Admitting: Internal Medicine

## 2015-05-29 ENCOUNTER — Encounter (HOSPITAL_COMMUNITY): Payer: Self-pay | Admitting: Adult Health

## 2015-05-29 DIAGNOSIS — R1084 Generalized abdominal pain: Secondary | ICD-10-CM

## 2015-05-29 DIAGNOSIS — Z880 Allergy status to penicillin: Secondary | ICD-10-CM | POA: Insufficient documentation

## 2015-05-29 DIAGNOSIS — R112 Nausea with vomiting, unspecified: Secondary | ICD-10-CM | POA: Insufficient documentation

## 2015-05-29 DIAGNOSIS — R109 Unspecified abdominal pain: Principal | ICD-10-CM | POA: Insufficient documentation

## 2015-05-29 DIAGNOSIS — F1210 Cannabis abuse, uncomplicated: Secondary | ICD-10-CM

## 2015-05-29 DIAGNOSIS — R1110 Vomiting, unspecified: Secondary | ICD-10-CM | POA: Diagnosis present

## 2015-05-29 DIAGNOSIS — K5900 Constipation, unspecified: Secondary | ICD-10-CM | POA: Insufficient documentation

## 2015-05-29 LAB — COMPREHENSIVE METABOLIC PANEL WITH GFR
ALT: 14 U/L (ref 14–54)
AST: 20 U/L (ref 15–41)
Albumin: 5 g/dL (ref 3.5–5.0)
Alkaline Phosphatase: 60 U/L (ref 38–126)
Anion gap: 14 (ref 5–15)
BUN: 13 mg/dL (ref 6–20)
CO2: 25 mmol/L (ref 22–32)
Calcium: 10.2 mg/dL (ref 8.9–10.3)
Chloride: 98 mmol/L — ABNORMAL LOW (ref 101–111)
Creatinine, Ser: 0.87 mg/dL (ref 0.44–1.00)
GFR calc Af Amer: >60 mL/min
GFR calc non Af Amer: >60 mL/min
Glucose, Bld: 107 mg/dL — ABNORMAL HIGH (ref 65–99)
Potassium: 3.6 mmol/L (ref 3.5–5.1)
Sodium: 137 mmol/L (ref 135–145)
Total Bilirubin: 1.6 mg/dL — ABNORMAL HIGH (ref 0.3–1.2)
Total Protein: 8.4 g/dL — ABNORMAL HIGH (ref 6.5–8.1)

## 2015-05-29 LAB — URINALYSIS, ROUTINE W REFLEX MICROSCOPIC
BILIRUBIN URINE: NEGATIVE
Glucose, UA: NEGATIVE mg/dL
Hgb urine dipstick: NEGATIVE
Ketones, ur: >80 mg/dL — AB
Leukocytes, UA: NEGATIVE
Nitrite: NEGATIVE
PH: 7 (ref 5.0–8.0)
Protein, ur: NEGATIVE mg/dL
Specific Gravity, Urine: 1.018 (ref 1.005–1.030)
Urobilinogen, UA: 1 mg/dL (ref 0.0–1.0)

## 2015-05-29 LAB — CBC WITH DIFFERENTIAL/PLATELET
Basophils Absolute: 0 10*3/uL (ref 0.0–0.1)
Basophils Relative: 0 % (ref 0–1)
Eosinophils Absolute: 0 10*3/uL (ref 0.0–0.7)
Eosinophils Relative: 0 % (ref 0–5)
HCT: 45.6 % (ref 36.0–46.0)
Hemoglobin: 17.1 g/dL — ABNORMAL HIGH (ref 12.0–15.0)
Lymphocytes Relative: 13 % (ref 12–46)
Lymphs Abs: 1.6 10*3/uL (ref 0.7–4.0)
MCH: 32.6 pg (ref 26.0–34.0)
MCHC: 37.5 g/dL — AB (ref 30.0–36.0)
MCV: 87 fL (ref 78.0–100.0)
MONO ABS: 0.7 10*3/uL (ref 0.1–1.0)
MONOS PCT: 6 % (ref 3–12)
NEUTROS ABS: 9.8 10*3/uL — AB (ref 1.7–7.7)
Neutrophils Relative %: 81 % — ABNORMAL HIGH (ref 43–77)
Platelets: 357 10*3/uL (ref 150–400)
RBC: 5.24 MIL/uL — ABNORMAL HIGH (ref 3.87–5.11)
RDW: 13.1 % (ref 11.5–15.5)
WBC: 12 10*3/uL — AB (ref 4.0–10.5)

## 2015-05-29 LAB — CBC
HCT: 38.1 % (ref 36.0–46.0)
Hemoglobin: 13.7 g/dL (ref 12.0–15.0)
MCH: 31.8 pg (ref 26.0–34.0)
MCHC: 36 g/dL (ref 30.0–36.0)
MCV: 88.4 fL (ref 78.0–100.0)
Platelets: 325 10*3/uL (ref 150–400)
RBC: 4.31 MIL/uL (ref 3.87–5.11)
RDW: 13 % (ref 11.5–15.5)
WBC: 10.5 10*3/uL (ref 4.0–10.5)

## 2015-05-29 LAB — LIPASE, BLOOD: Lipase: 18 U/L — ABNORMAL LOW (ref 22–51)

## 2015-05-29 LAB — I-STAT BETA HCG BLOOD, ED (MC, WL, AP ONLY)

## 2015-05-29 MED ORDER — SODIUM CHLORIDE 0.9 % IV BOLUS (SEPSIS)
1000.0000 mL | Freq: Once | INTRAVENOUS | Status: AC
  Administered 2015-05-29: 1000 mL via INTRAVENOUS

## 2015-05-29 MED ORDER — SENNOSIDES-DOCUSATE SODIUM 8.6-50 MG PO TABS: 1.0000 | ORAL_TABLET | Freq: Every evening | ORAL | Status: DC | PRN

## 2015-05-29 MED ORDER — PROMETHAZINE HCL 25 MG/ML IJ SOLN
25.0000 mg | INTRAMUSCULAR | Status: DC | PRN
  Administered 2015-05-29 – 2015-05-30 (×4): 25 mg via INTRAVENOUS
  Filled 2015-05-29 (×4): qty 1

## 2015-05-29 MED ORDER — ONDANSETRON HCL 4 MG/2ML IJ SOLN
4.0000 mg | Freq: Once | INTRAMUSCULAR | Status: AC
  Administered 2015-05-29: 4 mg via INTRAVENOUS
  Filled 2015-05-29: qty 2

## 2015-05-29 MED ORDER — PANTOPRAZOLE SODIUM 40 MG IV SOLR
40.0000 mg | Freq: Once | INTRAVENOUS | Status: AC
  Administered 2015-05-29: 40 mg via INTRAVENOUS
  Filled 2015-05-29: qty 40

## 2015-05-29 MED ORDER — METOCLOPRAMIDE HCL 5 MG/ML IJ SOLN
10.0000 mg | Freq: Once | INTRAMUSCULAR | Status: AC
  Administered 2015-05-29: 10 mg via INTRAVENOUS
  Filled 2015-05-29: qty 2

## 2015-05-29 MED ORDER — ENOXAPARIN SODIUM 40 MG/0.4ML ~~LOC~~ SOLN
40.0000 mg | SUBCUTANEOUS | Status: DC
  Administered 2015-05-29: 40 mg via SUBCUTANEOUS
  Filled 2015-05-29: qty 0.4

## 2015-05-29 MED ORDER — SODIUM CHLORIDE 0.9 % IV SOLN: INTRAVENOUS | Status: DC

## 2015-05-29 MED ORDER — DICYCLOMINE HCL 20 MG PO TABS: 20.0000 mg | ORAL_TABLET | Freq: Two times a day (BID) | ORAL | Status: DC

## 2015-05-29 MED ORDER — PANTOPRAZOLE SODIUM 40 MG IV SOLR
40.0000 mg | Freq: Two times a day (BID) | INTRAVENOUS | Status: DC
  Administered 2015-05-29 – 2015-05-30 (×2): 40 mg via INTRAVENOUS
  Filled 2015-05-29 (×4): qty 40

## 2015-05-29 MED ORDER — BISACODYL 10 MG RE SUPP
10.0000 mg | Freq: Once | RECTAL | Status: AC
  Administered 2015-05-29: 10 mg via RECTAL
  Filled 2015-05-29: qty 1

## 2015-05-29 MED ORDER — PROMETHAZINE HCL 25 MG/ML IJ SOLN
12.5000 mg | Freq: Four times a day (QID) | INTRAMUSCULAR | Status: DC | PRN
  Administered 2015-05-29: 12.5 mg via INTRAVENOUS
  Filled 2015-05-29: qty 1

## 2015-05-29 MED ORDER — FUROSEMIDE 10 MG/ML IJ SOLN: 40.0000 mg | Freq: Two times a day (BID) | INTRAMUSCULAR | Status: DC

## 2015-05-29 NOTE — ED Notes (Signed)
MD at bedside. 

## 2015-05-29 NOTE — ED Notes (Signed)
Presents with 2 days of nausea and vomiting-inability to hold anything down- actively vomiting at triage-feels weak all over-denies diarrhea. Denies fevers-

## 2015-05-29 NOTE — ED Provider Notes (Signed)
CSN: [id number]     Arrival date & time 05/29/15  1548 History   First MD Initiated Contact with Patient 05/29/15 1629     Chief Complaint  Patient presents with  . Emesis     HPI Presents with 2 days of nausea and vomiting-inability to hold anything down- actively vomiting at triage-feels weak all over-denies diarrhea. Denies fevers-patient has previous history of ulcers.  Takes Prilosec for the same.  . Past Medical History  Diagnosis Date  . Ulcer of the stomach and intestine    History reviewed. No pertinent past surgical history. History reviewed. No pertinent family history. History  Substance Use Topics  . Smoking status: Never Smoker   . Smokeless tobacco: Not on file  . Alcohol Use: No   OB History    No data available     Review of Systems All other systems reviewed and are negative   Allergies  Penicillins  Home Medications   Prior to Admission medications   Medication Sig Start Date End Date Taking? Authorizing Provider  dicyclomine (BENTYL) 20 MG tablet Take 1 tablet (20 mg total) by mouth 2 (two) times daily. 01/20/15  Yes Noland Fordyce, PA-C  HYDROcodone-acetaminophen (NORCO/VICODIN) 5-325 MG per tablet Take 1 tablet by mouth every 4 (four) hours as needed. Patient taking differently: Take 1 tablet by mouth every 4 (four) hours as needed for moderate pain.  04/01/15  Yes Kristen N Ward, DO  omeprazole (PRILOSEC) 40 MG capsule Take 1 capsule (40 mg total) by mouth daily. 04/01/15  Yes Kristen N Ward, DO  sucralfate (CARAFATE) 1 G tablet Take 1 tablet (1 g total) by mouth 4 (four) times daily -  with meals and at bedtime. 04/01/15  Yes Kristen N Ward, DO  promethazine (PHENERGAN) 25 MG tablet Take 1 tablet (25 mg total) by mouth every 6 (six) hours as needed for nausea or vomiting. 05/30/15   Shela Leff, MD   BP 125/68 mmHg  Pulse 75  Temp(Src) 98 F (36.7 C) (Oral)  Resp 16  Wt 136 lb 0.4 oz (61.7 kg)  SpO2 100%  LMP 05/17/2015 (Approximate) Physical  Exam  Constitutional: She is oriented to person, place, and time. She appears well-developed and well-nourished. No distress.  HENT:  Head: Normocephalic and atraumatic.  Eyes: Pupils are equal, round, and reactive to light.  Neck: Normal range of motion.  Cardiovascular: Normal rate and intact distal pulses.   Pulmonary/Chest: No respiratory distress.  Abdominal: Normal appearance. She exhibits no distension. There is tenderness (mild) in the epigastric area. There is no rebound and no guarding.    Musculoskeletal: Normal range of motion.  Neurological: She is alert and oriented to person, place, and time. No cranial nerve deficit.  Skin: Skin is warm and dry. No rash noted.  Psychiatric: She has a normal mood and affect. Her behavior is normal.  Nursing note and vitals reviewed.   ED Course  Procedures (including critical care time) Labs Review Labs Reviewed  CBC WITH DIFFERENTIAL/PLATELET - Abnormal; Notable for the following:    WBC 12.0 (*)    RBC 5.24 (*)    Hemoglobin 17.1 (*)    MCHC 37.5 (*)    Neutrophils Relative % 81 (*)    Neutro Abs 9.8 (*)    All other components within normal limits  COMPREHENSIVE METABOLIC PANEL - Abnormal; Notable for the following:    Chloride 98 (*)    Glucose, Bld 107 (*)    Total Protein 8.4 (*)  Total Bilirubin 1.6 (*)    All other components within normal limits  LIPASE, BLOOD - Abnormal; Notable for the following:    Lipase 18 (*)    All other components within normal limits  URINALYSIS, ROUTINE W REFLEX MICROSCOPIC (NOT AT [facility]) - Abnormal; Notable for the following:    Ketones, ur >80 (*)    All other components within normal limits  COMPREHENSIVE METABOLIC PANEL - Abnormal; Notable for the following:    AST 14 (*)    ALT 11 (*)    Total Bilirubin 1.7 (*)    All other components within normal limits  CBC - Abnormal; Notable for the following:    WBC 10.8 (*)    All other components within normal limits  HEMOGLOBIN A1C    CBC  HIV ANTIBODY (ROUTINE TESTING)  I-STAT BETA HCG BLOOD, ED ([facility], [facility], [facility] ONLY)    Imaging Review No results found.  Results for orders placed or performed during the hospital encounter of 05/29/15  CBC with Differential  Result Value Ref Range   WBC 12.0 (H) 4.0 - 10.5 K/uL   RBC 5.24 (H) 3.87 - 5.11 MIL/uL   Hemoglobin 17.1 (H) 12.0 - 15.0 g/dL   HCT 45.6 36.0 - 46.0 %   MCV 87.0 78.0 - 100.0 fL   MCH 32.6 26.0 - 34.0 pg   MCHC 37.5 (H) 30.0 - 36.0 g/dL   RDW 13.1 11.5 - 15.5 %   Platelets 357 150 - 400 K/uL   Neutrophils Relative % 81 (H) 43 - 77 %   Neutro Abs 9.8 (H) 1.7 - 7.7 K/uL   Lymphocytes Relative 13 12 - 46 %   Lymphs Abs 1.6 0.7 - 4.0 K/uL   Monocytes Relative 6 3 - 12 %   Monocytes Absolute 0.7 0.1 - 1.0 K/uL   Eosinophils Relative 0 0 - 5 %   Eosinophils Absolute 0.0 0.0 - 0.7 K/uL   Basophils Relative 0 0 - 1 %   Basophils Absolute 0.0 0.0 - 0.1 K/uL  Comprehensive metabolic panel  Result Value Ref Range   Sodium 137 135 - 145 mmol/L   Potassium 3.6 3.5 - 5.1 mmol/L   Chloride 98 (L) 101 - 111 mmol/L   CO2 25 22 - 32 mmol/L   Glucose, Bld 107 (H) 65 - 99 mg/dL   BUN 13 6 - 20 mg/dL   Creatinine, Ser 0.87 0.44 - 1.00 mg/dL   Calcium 10.2 8.9 - 10.3 mg/dL   Total Protein 8.4 (H) 6.5 - 8.1 g/dL   Albumin 5.0 3.5 - 5.0 g/dL   AST 20 15 - 41 U/L   ALT 14 14 - 54 U/L   Alkaline Phosphatase 60 38 - 126 U/L   Total Bilirubin 1.6 (H) 0.3 - 1.2 mg/dL   GFR calc non Af Amer >60 >60 mL/min   GFR calc Af Amer >60 >60 mL/min   Anion gap 14 5 - 15  Lipase, blood  Result Value Ref Range   Lipase 18 (L) 22 - 51 U/L  Urinalysis, Routine w reflex microscopic (not at [facility])  Result Value Ref Range   Color, Urine YELLOW YELLOW   APPearance CLEAR CLEAR   Specific Gravity, Urine 1.018 1.005 - 1.030   pH 7.0 5.0 - 8.0   Glucose, UA NEGATIVE NEGATIVE mg/dL   Hgb urine dipstick NEGATIVE NEGATIVE   Bilirubin Urine NEGATIVE NEGATIVE   Ketones, ur >80 (A) NEGATIVE  mg/dL   Protein, ur NEGATIVE NEGATIVE  mg/dL   Urobilinogen, UA 1.0 0.0 - 1.0 mg/dL   Nitrite NEGATIVE NEGATIVE   Leukocytes, UA NEGATIVE NEGATIVE  Comprehensive metabolic panel  Result Value Ref Range   Sodium 138 135 - 145 mmol/L   Potassium 4.0 3.5 - 5.1 mmol/L   Chloride 103 101 - 111 mmol/L   CO2 24 22 - 32 mmol/L   Glucose, Bld 93 65 - 99 mg/dL   BUN 10 6 - 20 mg/dL   Creatinine, Ser 0.87 0.44 - 1.00 mg/dL   Calcium 9.2 8.9 - 10.3 mg/dL   Total Protein 7.0 6.5 - 8.1 g/dL   Albumin 3.9 3.5 - 5.0 g/dL   AST 14 (L) 15 - 41 U/L   ALT 11 (L) 14 - 54 U/L   Alkaline Phosphatase 48 38 - 126 U/L   Total Bilirubin 1.7 (H) 0.3 - 1.2 mg/dL   GFR calc non Af Amer >60 >60 mL/min   GFR calc Af Amer >60 >60 mL/min   Anion gap 11 5 - 15  CBC  Result Value Ref Range   WBC 10.8 (H) 4.0 - 10.5 K/uL   RBC 4.36 3.87 - 5.11 MIL/uL   Hemoglobin 13.4 12.0 - 15.0 g/dL   HCT 38.6 36.0 - 46.0 %   MCV 88.5 78.0 - 100.0 fL   MCH 30.7 26.0 - 34.0 pg   MCHC 34.7 30.0 - 36.0 g/dL   RDW 13.1 11.5 - 15.5 %   Platelets 337 150 - 400 K/uL  Hemoglobin A1c  Result Value Ref Range   Hgb A1c MFr Bld 5.1 4.8 - 5.6 %   Mean Plasma Glucose 100 mg/dL  CBC  Result Value Ref Range   WBC 10.5 4.0 - 10.5 K/uL   RBC 4.31 3.87 - 5.11 MIL/uL   Hemoglobin 13.7 12.0 - 15.0 g/dL   HCT 38.1 36.0 - 46.0 %   MCV 88.4 78.0 - 100.0 fL   MCH 31.8 26.0 - 34.0 pg   MCHC 36.0 30.0 - 36.0 g/dL   RDW 13.0 11.5 - 15.5 %   Platelets 325 150 - 400 K/uL  HIV antibody  Result Value Ref Range   HIV Screen 4th Generation wRfx Non Reactive Non Reactive  I-Stat Beta hCG blood, ED ([facility], [facility], [facility] only)  Result Value Ref Range   I-stat hCG, quantitative <5.0 <5 mIU/mL   Comment 3           US Abdomen Complete  05/29/2015   CLINICAL DATA:  Abdominal pain  EXAM: ULTRASOUND ABDOMEN COMPLETE  COMPARISON:  Abdominal CT 04/01/2015  FINDINGS: Gallbladder: No gallstones or wall thickening visualized. No sonographic Murphy sign noted.  Common  bile duct: Diameter: 5 mm. Where visualized, no filling defect.  Liver: No focal lesion identified. Within normal limits in parenchymal echogenicity. Antegrade flow in the imaged portal venous system.  IVC: No abnormality visualized.  Pancreas: Visualized portion unremarkable.  Spleen: Size and appearance within normal limits.  Right Kidney: Length: 12 cm. Echogenicity within normal limits. No mass or hydronephrosis visualized.  Left Kidney: Length: 11 cm. Echogenicity within normal limits. No mass or hydronephrosis visualized.  Abdominal aorta: No aneurysm visualized.  IMPRESSION: Normal abdominal ultrasound.   Electronically Signed   By: Monte Fantasia M.D.   On: 05/29/2015 22:49      MDM   Final diagnoses:  Intractable vomiting with nausea, vomiting of unspecified type        Leonard Schwartz, MD 06/12/15 [id number]

## 2015-05-29 NOTE — Progress Notes (Signed)
Called report:   

## 2015-05-29 NOTE — H&P (Signed)
Date: 05/29/2015               Patient Name:  Tammy Pittman MRN: [id number]  DOB: 09/11/1976 Age / Sex: 39 y.o., female   PCP: Lyndal Rainbow, MD         Medical Service: Internal Medicine Teaching Service         Attending Physician: Dr. Oval Linsey, MD    First Contact: Dr. Ronnald Ramp Pager: [phone]  Second Contact: Dr. Marlowe Sax Pager: [phone]       After Hours (After 5p/  First Contact Pager: [phone]  weekends / holidays): Second Contact Pager: [phone]   Chief Complaint: Vomiting     History of Present Illness: Tammy Pittman is a 39 year old female with patient reported history of IBS, acid reflux, and ulcers, presenting with 1 day history of abdominal pain and intractable vomiting.  She reports that every 2 months, she has episodes of constipation and abdominal pain that cause her to become nauseated and vomit.  She reports that she had an EGD at "[facility]" approximately one year ago, that showed ulcers and acid reflux.  This episode began one day ago, with constipation and nausea and vomiting.  She describes sharp, cramping, epigastric pain that is relieved with a BM or by leaning forward.  She endorses worsening symptoms of cramping and abdominal pain with fried foods.  She reports the feeling of food piling up in her stomach (points to epigastrum) when she eats, that leads to worsening nausea and vomiting.  She had a small BM this morning, but it did not relieve her symptoms.  She endorses occasional use of NSAIDs to treat headache, but says her last Advil was a month ago.  She denies the use of alcohol or marijuana.  However, multiple UDS's at [facility] and neighboring hospitals demonstrate positive cannabinoids on UDS.  She reports that she is taking prilosec twice daily, and a big, thick, white pill, presumably sucralfate.  She endorses the use of dulcolax suppositories to relieve constipation, but says that they also make her nauseous.  She is prescribed  dicyclomine, but she denies using it.    She has not had any follow-up with her PCP or gastroenterologist.  She has had a multiple admissions at different hospitals for the same symptoms in the past.     She denies chest pain, fever, or trouble breathing.  Meds: Current Facility-Administered Medications  Medication Dose Route Frequency Provider Last Rate Last Dose  . 0.9 %  sodium chloride infusion   Intravenous STAT Leonard Schwartz, MD      . enoxaparin (LOVENOX) injection 40 mg  40 mg Subcutaneous Q24H Tasrif Ahmed, MD   40 mg at 05/29/15 2143  . pantoprazole (PROTONIX) injection 40 mg  40 mg Intravenous Q12H Tasrif Ahmed, MD      . promethazine (PHENERGAN) injection 25 mg  25 mg Intravenous Q4H PRN Leonard Schwartz, MD   25 mg at 05/29/15 1945    Allergies: Allergies as of 05/29/2015 - Review Complete 05/29/2015  Allergen Reaction Noted  . Penicillins Hives 02/08/2014   Past Medical History  Diagnosis Date  . Ulcer of the stomach and intestine    History reviewed. No pertinent past surgical history. History reviewed. No pertinent family history. History   Social History  . Marital Status: Married    Spouse Name: N/A  . Number of Children: N/A  . Years of Education: N/A   Occupational History  . Not on file.   Social  History Main Topics  . Smoking status: Never Smoker   . Smokeless tobacco: Not on file  . Alcohol Use: No  . Drug Use: No  . Sexual Activity: Not on file   Other Topics Concern  . Not on file   Social History Narrative    Review of Systems: Pertinent items are noted in HPI.  Physical Exam: Blood pressure 126/64, pulse 73, temperature 99.6 F (37.6 C), temperature source Oral, resp. rate 16, weight 136 lb 0.4 oz (61.7 kg), last menstrual period 05/17/2015, SpO2 98 %. Physical Exam  Constitutional: She is oriented to person, place, and time.  Well developed, well nourished, in mild distress, shaking on bed  HENT:  Head: Normocephalic and atraumatic.   Eyes: EOM are normal.  Neck: No JVD present. No tracheal deviation present.  Cardiovascular: Normal rate, regular rhythm and normal heart sounds.   Pulmonary/Chest: Effort normal and breath sounds normal. No respiratory distress. She has no wheezes. She has no rales.  Abdominal: Soft. Bowel sounds are normal. There is tenderness. There is no rebound and no guarding.  Positive Murphy's sign  Musculoskeletal: She exhibits no edema.  Neurological: She is alert and oriented to person, place, and time.  Skin: Skin is warm and dry. No rash noted. No erythema.    Lab results: Basic Metabolic Panel:  Recent Labs  05/29/15 1624  NA 137  K 3.6  CL 98*  CO2 25  GLUCOSE 107*  BUN 13  CREATININE 0.87  CALCIUM 10.2   Liver Function Tests:  Recent Labs  05/29/15 1624  AST 20  ALT 14  ALKPHOS 60  BILITOT 1.6*  PROT 8.4*  ALBUMIN 5.0    Recent Labs  05/29/15 1624  LIPASE 18*   No results for input(s): AMMONIA in the last 72 hours. CBC:  Recent Labs  05/29/15 1624  WBC 12.0*  NEUTROABS 9.8*  HGB 17.1*  HCT 45.6  MCV 87.0  PLT 357   Cardiac Enzymes: No results for input(s): CKTOTAL, CKMB, CKMBINDEX, TROPONINI in the last 72 hours. BNP: No results for input(s): PROBNP in the last 72 hours. D-Dimer: No results for input(s): DDIMER in the last 72 hours. CBG: No results for input(s): GLUCAP in the last 72 hours. Hemoglobin A1C: No results for input(s): HGBA1C in the last 72 hours. Fasting Lipid Panel: No results for input(s): CHOL, HDL, LDLCALC, TRIG, CHOLHDL, LDLDIRECT in the last 72 hours. Thyroid Function Tests: No results for input(s): TSH, T4TOTAL, FREET4, T3FREE, THYROIDAB in the last 72 hours. Anemia Panel: No results for input(s): VITAMINB12, FOLATE, FERRITIN, TIBC, IRON, RETICCTPCT in the last 72 hours. Coagulation: No results for input(s): LABPROT, INR in the last 72 hours. Urine Drug Screen: Drugs of Abuse  No results found for: LABOPIA,  COCAINSCRNUR, LABBENZ, AMPHETMU, THCU, LABBARB  Alcohol Level: No results for input(s): ETH in the last 72 hours. Urinalysis: No results for input(s): COLORURINE, LABSPEC, PHURINE, GLUCOSEU, HGBUR, BILIRUBINUR, KETONESUR, PROTEINUR, UROBILINOGEN, NITRITE, LEUKOCYTESUR in the last 72 hours.  Invalid input(s): APPERANCEUR   Imaging results:  No results found.   Assessment & Plan by Problem: Active Problems:   Vomiting  Ms. Gomes is a 39 yo female with GERD, IBS, and PUD, presenting with recurrent episode of abdominal pain and nausea/vomiting.    1. Nausea/Vomiting: This a common, recurrent episode for Ms. Braddy, with multiple admissions to local hospitals for the same complaint.  The patient attributes the symptoms to constipation and ulcers, which is possible.  However, patient's positive Murphy's  sign and subjective feeling of food piling in her epigastric region is concerning for alternative etiology, including cholelithiasis or esophageal disease.  She is still unable to give her the name of her GI doctor, so the results of her reported EGD are unconfirmed.  Will control nausea with IV medications and obtain abdominal US.  Consider investigation of esophageal disease or gastric emptying studies. Patient also has history of THC abuse, which may be contributing to repeated episodes of vomiting.  Should also consider possibility of secondary gain, given same story for multiple hospitalizations. - Phenergan 25 mg q4h PRN nausea/vomiting - Protonix 40 mg IV BID - Abdominal US, including RUQ - UDS  FEN/GI - NS @ 150 ml/hr - Dulcolax suppository once. - NPO    DVT Ppx: Lovenox 40 mg [place] daily  Dispo: Disposition is deferred at this time, awaiting improvement of current medical problems.   The patient does have a current PCP Lyndal Rainbow, MD) and does need an [facility] hospital follow-up appointment after discharge.  The patient does not have transportation limitations that hinder  transportation to clinic appointments.  Signed: Iline Oven, MD 05/29/2015, 9:55 PM

## 2015-05-30 DIAGNOSIS — R109 Unspecified abdominal pain: Secondary | ICD-10-CM | POA: Insufficient documentation

## 2015-05-30 DIAGNOSIS — R112 Nausea with vomiting, unspecified: Secondary | ICD-10-CM

## 2015-05-30 LAB — CBC
HCT: 38.6 % (ref 36.0–46.0)
HEMOGLOBIN: 13.4 g/dL (ref 12.0–15.0)
MCH: 30.7 pg (ref 26.0–34.0)
MCHC: 34.7 g/dL (ref 30.0–36.0)
MCV: 88.5 fL (ref 78.0–100.0)
PLATELETS: 337 10*3/uL (ref 150–400)
RBC: 4.36 MIL/uL (ref 3.87–5.11)
RDW: 13.1 % (ref 11.5–15.5)
WBC: 10.8 10*3/uL — AB (ref 4.0–10.5)

## 2015-05-30 LAB — COMPREHENSIVE METABOLIC PANEL
ALK PHOS: 48 U/L (ref 38–126)
ALT: 11 U/L — ABNORMAL LOW (ref 14–54)
AST: 14 U/L — ABNORMAL LOW (ref 15–41)
Albumin: 3.9 g/dL (ref 3.5–5.0)
Anion gap: 11 (ref 5–15)
BUN: 10 mg/dL (ref 6–20)
CHLORIDE: 103 mmol/L (ref 101–111)
CO2: 24 mmol/L (ref 22–32)
CREATININE: 0.87 mg/dL (ref 0.44–1.00)
Calcium: 9.2 mg/dL (ref 8.9–10.3)
GFR calc Af Amer: >60 mL/min (ref >60)
GFR calc non Af Amer: >60 mL/min (ref >60)
GLUCOSE: 93 mg/dL (ref 65–99)
Potassium: 4 mmol/L (ref 3.5–5.1)
SODIUM: 138 mmol/L (ref 135–145)
TOTAL PROTEIN: 7 g/dL (ref 6.5–8.1)
Total Bilirubin: 1.7 mg/dL — ABNORMAL HIGH (ref 0.3–1.2)

## 2015-05-30 LAB — HIV ANTIBODY (ROUTINE TESTING W REFLEX): HIV Screen 4th Generation wRfx: NONREACTIVE

## 2015-05-30 MED ORDER — PROMETHAZINE HCL 25 MG PO TABS: 25.0000 mg | ORAL_TABLET | Freq: Four times a day (QID) | ORAL | Status: DC | PRN

## 2015-05-30 MED ORDER — ACETAMINOPHEN 325 MG PO TABS
650.0000 mg | ORAL_TABLET | Freq: Four times a day (QID) | ORAL | Status: DC | PRN
  Administered 2015-05-30 (×2): 650 mg via ORAL
  Filled 2015-05-30 (×2): qty 2

## 2015-05-30 MED ORDER — MAGNESIUM CITRATE PO SOLN
1.0000 | Freq: Once | ORAL | Status: DC
  Filled 2015-05-30: qty 296

## 2015-05-30 NOTE — Progress Notes (Signed)
Pt handed the MD note for her job; pt ambulated off unit with belongings to the side. Francis Gaines Laylonie Marzec RN.

## 2015-05-30 NOTE — Progress Notes (Signed)
Pt discharge education and instructions completed with pt at bedside; pt voices understanding denies any questions. Pt IV removed by pt; pt discharge home with family to transport her home. Pt to pick up electronically sent prescription from preferred pharmacy on file. Pt offered a wheelchair but pt she declined and decided to walk out. Pt angry and cussing at RN about prior incident of the urine sample needed; pt was explained to off hospital protocol and policy; Charge RN notified and aware of situation. Francis Gaines Jud Fanguy RN.

## 2015-05-30 NOTE — Progress Notes (Signed)
Subjective: Patient was seen and examined at bedside today. Appeared to be in no acute distress.Reports feeling nauseous, no vomiting, In addition, patient reports she still has some abdominal pain and is constipated. No other complaints.   Later in the day, nursing staff informed me that when patient was asked for a urine sample. She went into the bathroom with her boyfriend and when nursing staff told her they could not accept the sample and that she might have to give another one, patient became angry and demanded to leave. I spoke to the patient over the phone and she said she was feeling better and wanted to leave as soon as possible.    Objective: Vital signs in last 24 hours: Filed Vitals:   05/29/15 1845 05/29/15 2029 05/29/15 2031 05/30/15 0636  BP: 126/64   125/68  Pulse: 73   75  Temp:  99.6 F (37.6 C)  98 F (36.7 C)  TempSrc:  Oral    Resp:      Weight:   136 lb 0.4 oz (61.7 kg)   SpO2: 98%   100%   Weight change:   Intake/Output Summary (Last 24 hours) at 05/30/15 1642 Last data filed at 05/30/15 0415  Gross per 24 hour  Intake   1000 ml  Output      0 ml  Net   1000 ml   Physical exam  Constitutional: She is oriented to person, place, and time. Well developed, well nourished HENT:  Head: Normocephalic and atraumatic.  Eyes: EOM are normal.  Neck: No JVD present. No tracheal deviation present.  Cardiovascular: Normal rate, regular rhythm and normal heart sounds.  Pulmonary/Chest: Effort normal and breath sounds normal. No respiratory distress. She has no wheezes. She has no rales.  Abdominal: Soft. Bowel sounds are normal. There is tenderness. There is no rebound and no guarding. Musculoskeletal: She exhibits no edema.  Neurological: She is alert and oriented to person, place, and time.  Skin: Skin is warm and dry. No rash noted. No erythema.    Lab Results: Basic Metabolic Panel:  Recent Labs Lab 05/29/15 1624 05/30/15 0405  NA 137 138  K 3.6  4.0  CL 98* 103  CO2 25 24  GLUCOSE 107* 93  BUN 13 10  CREATININE 0.87 0.87  CALCIUM 10.2 9.2   Liver Function Tests:  Recent Labs Lab 05/29/15 1624 05/30/15 0405  AST 20 14*  ALT 14 11*  ALKPHOS 60 48  BILITOT 1.6* 1.7*  PROT 8.4* 7.0  ALBUMIN 5.0 3.9    Recent Labs Lab 05/29/15 1624  LIPASE 18*   CBC:  Recent Labs Lab 05/29/15 1624 05/29/15 2204 05/30/15 0405  WBC 12.0* 10.5 10.8*  NEUTROABS 9.8*  --   --   HGB 17.1* 13.7 13.4  HCT 45.6 38.1 38.6  MCV 87.0 88.4 88.5  PLT 357 325 337   Urinalysis:  Recent Labs Lab 05/29/15 2214  COLORURINE YELLOW  LABSPEC 1.018  PHURINE 7.0  GLUCOSEU NEGATIVE  HGBUR NEGATIVE  BILIRUBINUR NEGATIVE  KETONESUR >80*  PROTEINUR NEGATIVE  UROBILINOGEN 1.0  NITRITE NEGATIVE  LEUKOCYTESUR NEGATIVE   Micro Results: No results found for this or any previous visit (from the past 240 hour(s)). Studies/Results: US Abdomen Complete  05/29/2015   CLINICAL DATA:  Abdominal pain  EXAM: ULTRASOUND ABDOMEN COMPLETE  COMPARISON:  Abdominal CT 04/01/2015  FINDINGS: Gallbladder: No gallstones or wall thickening visualized. No sonographic Murphy sign noted.  Common bile duct: Diameter: 5 mm. Where visualized,  no filling defect.  Liver: No focal lesion identified. Within normal limits in parenchymal echogenicity. Antegrade flow in the imaged portal venous system.  IVC: No abnormality visualized.  Pancreas: Visualized portion unremarkable.  Spleen: Size and appearance within normal limits.  Right Kidney: Length: 12 cm. Echogenicity within normal limits. No mass or hydronephrosis visualized.  Left Kidney: Length: 11 cm. Echogenicity within normal limits. No mass or hydronephrosis visualized.  Abdominal aorta: No aneurysm visualized.  IMPRESSION: Normal abdominal ultrasound.   Electronically Signed   By: Monte Fantasia M.D.   On: 05/29/2015 22:49   Medications: I have reviewed the patient's current medications. Scheduled Meds: Continuous  Infusions: PRN Meds:. Assessment/Plan: Active Problems:   Vomiting   Abdominal pain   Nausea with vomiting   39 year old woman with a history of irritable bowel syndrome, gastroesophageal reflux disease, and ulcers per her report, who presents with a one-day history of abdominal pain and vomiting.  1. Nausea/Vomiting: common, recurrent episode for Ms. Fitz, with multiple admissions to local hospitals for the same complaint. The patient attributes the symptoms to constipation and ulcers, which is possible.She is still unable to give her the name of her GI doctor, so the results of her reported EGD are unconfirmed.Normal abdominal US.  Patient also has history of THC abuse, which may be contributing to repeated episodes of vomiting.  - Continue Phenergan - Continue Protonix - UDS could not be done. As mentioned earlier, patient refused to give another urine sample and demanded to leave.  -Pain mgmt with Tylenol - Patient to be discharged home with Phenergan 25 mg tablet Q6 PRN nausea/ vomiting  FEN/GI - NS @ 150 ml/hr - Dulcolax suppository - NPO   DVT Ppx: Lovenox 40 mg [employer] daily   Dispo: Disposition is deferred at this time, awaiting improvement of current medical problems.  Anticipated discharge in approximately 0 day(s).   The patient does have a current PCP Lyndal Rainbow, MD) and does need an [facility] hospital follow-up appointment after discharge.  The patient does not have transportation limitations that hinder transportation to clinic appointments.  .Services Needed at time of discharge: Y = Yes, Blank = No PT:   OT:   RN:   Equipment:   Other:       Shela Leff, MD 05/30/2015, 4:42 PM

## 2015-05-30 NOTE — Progress Notes (Signed)
Pt was informed of urine needed from her; pt went to the bathroom with her female partner and came out with a urine sample she for RN to send; pt was informed since it was two people in the bathroom it's hard to know who produce the urine and that new hat will be provided and to call staff when ever she has to urine so get a new sample. Pt is mad and states "am ready to go!! He had a BM and I peed". Charge RN informed and MD paged. Francis Gaines Aleck Locklin RN.

## 2015-05-30 NOTE — Discharge Summary (Signed)
Name: Tammy Pittman MRN: [id number] DOB: 1976/03/05 39 y.o. PCP: Lyndal Rainbow, MD  Date of Admission: 05/29/2015  4:12 PM Date of Discharge: 05/31/2015 Attending Physician: No att. providers found  Discharge Diagnosis: Active Problems:   Vomiting   Abdominal pain   Nausea with vomiting  Discharge Medications:   Medication List    STOP taking these medications        traMADol 50 MG tablet  Commonly known as:  ULTRAM      TAKE these medications        dicyclomine 20 MG tablet  Commonly known as:  BENTYL  Take 1 tablet (20 mg total) by mouth 2 (two) times daily.     HYDROcodone-acetaminophen 5-325 MG per tablet  Commonly known as:  NORCO/VICODIN  Take 1 tablet by mouth every 4 (four) hours as needed.     omeprazole 40 MG capsule  Commonly known as:  PRILOSEC  Take 1 capsule (40 mg total) by mouth daily.     promethazine 25 MG tablet  Commonly known as:  PHENERGAN  Take 1 tablet (25 mg total) by mouth every 6 (six) hours as needed for nausea or vomiting.     sucralfate 1 G tablet  Commonly known as:  CARAFATE  Take 1 tablet (1 g total) by mouth 4 (four) times daily -  with meals and at bedtime.        Disposition and follow-up:   Tammy Pittman was discharged from [facility] in Good condition.  At the hospital follow up visit please address:  1.  Nausea and vomiting. Has this continued? Abdominal pain?  2.  Labs / imaging needed at time of follow-up: None  3.  Pending labs/ test needing follow-up: None  Follow-up Appointments: Follow-up Information    Follow up with CRAWFORD,GARY S, MD In 1 week.   Specialty:  Family Medicine   Why:  Please call to make an appointment      Discharge Instructions: Discharge Instructions    Call MD for:  persistant nausea and vomiting    Complete by:  As directed      Call MD for:  temperature >100.4    Complete by:  As directed            Procedures Performed:  US Abdomen  Complete  05/29/2015   CLINICAL DATA:  Abdominal pain  EXAM: ULTRASOUND ABDOMEN COMPLETE  COMPARISON:  Abdominal CT 04/01/2015  FINDINGS: Gallbladder: No gallstones or wall thickening visualized. No sonographic Murphy sign noted.  Common bile duct: Diameter: 5 mm. Where visualized, no filling defect.  Liver: No focal lesion identified. Within normal limits in parenchymal echogenicity. Antegrade flow in the imaged portal venous system.  IVC: No abnormality visualized.  Pancreas: Visualized portion unremarkable.  Spleen: Size and appearance within normal limits.  Right Kidney: Length: 12 cm. Echogenicity within normal limits. No mass or hydronephrosis visualized.  Left Kidney: Length: 11 cm. Echogenicity within normal limits. No mass or hydronephrosis visualized.  Abdominal aorta: No aneurysm visualized.  IMPRESSION: Normal abdominal ultrasound.   Electronically Signed   By: Monte Fantasia M.D.   On: 05/29/2015 22:49    Admission HPI: Tammy Pittman is a 39 year old female with patient reported history of IBS, acid reflux, and ulcers, presenting with 1 day history of abdominal pain and intractable vomiting. She reports that every 2 months, she has episodes of constipation and abdominal pain that cause her to become nauseated and vomit. She reports that she had  an EGD at "[facility]" approximately one year ago, that showed ulcers and acid reflux. This episode began one day ago, with constipation and nausea and vomiting. She describes sharp, cramping, epigastric pain that is relieved with a BM or by leaning forward. She endorses worsening symptoms of cramping and abdominal pain with fried foods. She reports the feeling of food piling up in her stomach (points to epigastrum) when she eats, that leads to worsening nausea and vomiting. She had a small BM this morning, but it did not relieve her symptoms. She endorses occasional use of NSAIDs to treat headache, but says her last Advil was a month  ago. She denies the use of alcohol or marijuana. However, multiple UDS's at [facility] and neighboring hospitals demonstrate positive cannabinoids on UDS. She reports that she is taking prilosec twice daily, and a big, thick, white pill, presumably sucralfate. She endorses the use of dulcolax suppositories to relieve constipation, but says that they also make her nauseous. She is prescribed dicyclomine, but she denies using it.   She has not had any follow-up with her PCP or gastroenterologist. She has had a multiple admissions at different hospitals for the same symptoms in the past.   She denies chest pain, fever, or trouble breathing.  Hospital Course by problem list:  Active Problems:   Vomiting   Abdominal pain   Nausea with vomiting   1. Nausea/Vomiting and Abdominal Pain: Common, recurrent episode for pt with multiple admissions to local hospitals for the same complaint. Pt attributed her symptoms to constipation and ulcers. However, she had a positive Murphy's sign and subjective feeling of food piling in her epigastric region which was concerning for alternative etiology, including cholelithiasis or esophageal disease. She was unable to give [place] the name of her GI doctor, so the results of her reported EGD were unconfirmed. She was given Phenergan, Zofran, Metoclopramide, and Protonix on day1 of hospitalization. Patient also had a history of THC abuse, which may have contributed to repeated episodes of vomiting. UDS was ordered, however, on day 2 of hospitalization, nursing staff informed me that when patient was asked for a urine sample. She went into the bathroom with her boyfriend and when nursing staff told her they could not accept the sample and that she might have to give another one, patient became angry and demanded to leave. I spoke to the patient over the phone and she said she was feeling better and wanted to leave as soon as possible. Pt was hemodynamically stable but  still had some abdominal pain and nausea. She was given Phenergan, Protonix, and Tylenol.Pt discharged home with Phenergan 25 mg tablet Q6 PRN nausea/ vomiting. For her chronic constipation, she was advised to take OTC Mag Citrate.    Discharge Vitals:   BP 125/68 mmHg  Pulse 75  Temp(Src) 98 F (36.7 C) (Oral)  Resp 16  Wt 136 lb 0.4 oz (61.7 kg)  SpO2 100%  LMP 05/17/2015 (Approximate)  Discharge Labs:  No results found for this or any previous visit (from the past 24 hour(s)).  Signed: Shela Leff, MD 05/31/2015, 10:34 PM    Services Ordered on Discharge: None Equipment Ordered on Discharge: None

## 2015-05-30 NOTE — Discharge Instructions (Signed)
1. You have a follow up appointment as follows:  Tammy Pittman  In 1 week  Please call to make an appointment  2. Please take all medications as previously prescribed with the following changes:  Take Phenergan as needed for nausea.   3. If you have worsening of your symptoms or new symptoms arise, please call the clinic ([phone]), or go to the ER immediately if symptoms are severe.   Nausea and Vomiting Nausea is a sick feeling that often comes before throwing up (vomiting). Vomiting is a reflex where stomach contents come out of your mouth. Vomiting can cause severe loss of body fluids (dehydration). Children and elderly adults can become dehydrated quickly, especially if they also have diarrhea. Nausea and vomiting are symptoms of a condition or disease. It is important to find the cause of your symptoms. CAUSES   Direct irritation of the stomach lining. This irritation can result from increased acid production (gastroesophageal reflux disease), infection, food poisoning, taking certain medicines (such as nonsteroidal anti-inflammatory drugs), alcohol use, or tobacco use.  Signals from the brain.These signals could be caused by a headache, heat exposure, an inner ear disturbance, increased pressure in the brain from injury, infection, a tumor, or a concussion, pain, emotional stimulus, or metabolic problems.  An obstruction in the gastrointestinal tract (bowel obstruction).  Illnesses such as diabetes, hepatitis, gallbladder problems, appendicitis, kidney problems, cancer, sepsis, atypical symptoms of a heart attack, or eating disorders.  Medical treatments such as chemotherapy and radiation.  Receiving medicine that makes you sleep (general anesthetic) during surgery. DIAGNOSIS Your caregiver may ask for tests to be done if the problems do not improve after a few days. Tests may also be done if symptoms are severe or if the reason for the nausea and vomiting is not clear. Tests  may include:  Urine tests.  Blood tests.  Stool tests.  Cultures (to look for evidence of infection).  X-rays or other imaging studies. Test results can help your caregiver make decisions about treatment or the need for additional tests. TREATMENT You need to stay well hydrated. Drink frequently but in small amounts.You may wish to drink water, sports drinks, clear broth, or eat frozen ice pops or gelatin dessert to help stay hydrated.When you eat, eating slowly may help prevent nausea.There are also some antinausea medicines that may help prevent nausea. HOME CARE INSTRUCTIONS   Take all medicine as directed by your caregiver.  If you do not have an appetite, do not force yourself to eat. However, you must continue to drink fluids.  If you have an appetite, eat a normal diet unless your caregiver tells you differently.  Eat a variety of complex carbohydrates (rice, wheat, potatoes, bread), lean meats, yogurt, fruits, and vegetables.  Avoid high-fat foods because they are more difficult to digest.  Drink enough water and fluids to keep your urine clear or pale yellow.  If you are dehydrated, ask your caregiver for specific rehydration instructions. Signs of dehydration may include:  Severe thirst.  Dry lips and mouth.  Dizziness.  Dark urine.  Decreasing urine frequency and amount.  Confusion.  Rapid breathing or pulse. SEEK IMMEDIATE MEDICAL CARE IF:   You have blood or brown flecks (like coffee grounds) in your vomit.  You have black or bloody stools.  You have a severe headache or stiff neck.  You are confused.  You have severe abdominal pain.  You have chest pain or trouble breathing.  You do not urinate at least once  every 8 hours.  You develop cold or clammy skin.  You continue to vomit for longer than 24 to 48 hours.  You have a fever. MAKE SURE YOU:   Understand these instructions.  Will watch your condition.  Will get help right away  if you are not doing well or get worse. Document Released: 11/12/2005 Document Revised: 02/04/2012 Document Reviewed: 04/11/2011 [facility] Patient Information 2015 [place], [place]. This information is not intended to replace advice given to you by your health care provider. Make sure you discuss any questions you have with your health care provider.

## 2015-05-31 LAB — HEMOGLOBIN A1C
HEMOGLOBIN A1C: 5.1 % (ref 4.8–5.6)
MEAN PLASMA GLUCOSE: 100 mg/dL

## 2015-11-10 ENCOUNTER — Encounter (HOSPITAL_BASED_OUTPATIENT_CLINIC_OR_DEPARTMENT_OTHER): Payer: Self-pay | Admitting: *Deleted

## 2015-11-10 ENCOUNTER — Emergency Department (HOSPITAL_BASED_OUTPATIENT_CLINIC_OR_DEPARTMENT_OTHER)
Admission: EM | Admit: 2015-11-10 | Discharge: 2015-11-10 | Disposition: A | Discharge status: Home / Self Care | Payer: Self-pay | Attending: Emergency Medicine | Admitting: Emergency Medicine

## 2015-11-10 DIAGNOSIS — R1084 Generalized abdominal pain: Secondary | ICD-10-CM | POA: Insufficient documentation

## 2015-11-10 DIAGNOSIS — Z8719 Personal history of other diseases of the digestive system: Secondary | ICD-10-CM | POA: Insufficient documentation

## 2015-11-10 DIAGNOSIS — R112 Nausea with vomiting, unspecified: Secondary | ICD-10-CM | POA: Insufficient documentation

## 2015-11-10 DIAGNOSIS — Z79899 Other long term (current) drug therapy: Secondary | ICD-10-CM | POA: Insufficient documentation

## 2015-11-10 DIAGNOSIS — Z880 Allergy status to penicillin: Secondary | ICD-10-CM | POA: Insufficient documentation

## 2015-11-10 DIAGNOSIS — R451 Restlessness and agitation: Secondary | ICD-10-CM | POA: Insufficient documentation

## 2015-11-10 DIAGNOSIS — Z3202 Encounter for pregnancy test, result negative: Secondary | ICD-10-CM | POA: Insufficient documentation

## 2015-11-10 LAB — CBC WITH DIFFERENTIAL/PLATELET
Basophils Absolute: 0 10*3/uL (ref 0.0–0.1)
Basophils Relative: 0 %
Eosinophils Absolute: 0 10*3/uL (ref 0.0–0.7)
Eosinophils Relative: 0 %
HCT: 39.9 % (ref 36.0–46.0)
HEMOGLOBIN: 14.1 g/dL (ref 12.0–15.0)
Lymphocytes Relative: 11 %
Lymphs Abs: 1 10*3/uL (ref 0.7–4.0)
MCH: 31.3 pg (ref 26.0–34.0)
MCHC: 35.3 g/dL (ref 30.0–36.0)
MCV: 88.5 fL (ref 78.0–100.0)
Monocytes Absolute: 0.4 10*3/uL (ref 0.1–1.0)
Monocytes Relative: 4 %
NEUTROS PCT: 85 %
Neutro Abs: 7.1 10*3/uL (ref 1.7–7.7)
PLATELETS: 372 10*3/uL (ref 150–400)
RBC: 4.51 MIL/uL (ref 3.87–5.11)
RDW: 12.2 % (ref 11.5–15.5)
WBC: 8.4 10*3/uL (ref 4.0–10.5)

## 2015-11-10 LAB — COMPREHENSIVE METABOLIC PANEL
ALK PHOS: 58 U/L (ref 38–126)
ALT: 23 U/L (ref 14–54)
ANION GAP: 10 (ref 5–15)
AST: 25 U/L (ref 15–41)
Albumin: 4.5 g/dL (ref 3.5–5.0)
BUN: 9 mg/dL (ref 6–20)
CO2: 26 mmol/L (ref 22–32)
Calcium: 9.7 mg/dL (ref 8.9–10.3)
Chloride: 103 mmol/L (ref 101–111)
Creatinine, Ser: 0.74 mg/dL (ref 0.44–1.00)
GFR calc non Af Amer: >60 mL/min (ref >60)
Glucose, Bld: 124 mg/dL — ABNORMAL HIGH (ref 65–99)
Potassium: 3.4 mmol/L — ABNORMAL LOW (ref 3.5–5.1)
SODIUM: 139 mmol/L (ref 135–145)
TOTAL PROTEIN: 7.7 g/dL (ref 6.5–8.1)
Total Bilirubin: 1 mg/dL (ref 0.3–1.2)

## 2015-11-10 LAB — URINE MICROSCOPIC-ADD ON
RBC / HPF: NONE SEEN RBC/hpf (ref 0–5)
WBC, UA: NONE SEEN WBC/hpf (ref 0–5)

## 2015-11-10 LAB — URINALYSIS, ROUTINE W REFLEX MICROSCOPIC
BILIRUBIN URINE: NEGATIVE
Glucose, UA: NEGATIVE mg/dL
Hgb urine dipstick: NEGATIVE
KETONES UR: NEGATIVE mg/dL
Leukocytes, UA: NEGATIVE
NITRITE: NEGATIVE
PH: 8.5 — AB (ref 5.0–8.0)
PROTEIN: 30 mg/dL — AB
Specific Gravity, Urine: 1.016 (ref 1.005–1.030)

## 2015-11-10 LAB — PREGNANCY, URINE: Preg Test, Ur: NEGATIVE

## 2015-11-10 LAB — LIPASE, BLOOD: Lipase: 28 U/L (ref 11–51)

## 2015-11-10 MED ORDER — SODIUM CHLORIDE 0.9 % IV SOLN: 1000.0000 mL | Freq: Once | INTRAVENOUS | Status: DC

## 2015-11-10 MED ORDER — SODIUM CHLORIDE 0.9 % IV SOLN
1000.0000 mL | Freq: Once | INTRAVENOUS | Status: AC
  Administered 2015-11-10: 1000 mL via INTRAVENOUS

## 2015-11-10 MED ORDER — PANTOPRAZOLE SODIUM 40 MG IV SOLR
40.0000 mg | Freq: Once | INTRAVENOUS | Status: AC
  Administered 2015-11-10: 40 mg via INTRAVENOUS
  Filled 2015-11-10: qty 40

## 2015-11-10 MED ORDER — SODIUM CHLORIDE 0.9 % IV SOLN: 1000.0000 mL | INTRAVENOUS | Status: DC

## 2015-11-10 MED ORDER — DIPHENHYDRAMINE HCL 50 MG/ML IJ SOLN
25.0000 mg | Freq: Once | INTRAMUSCULAR | Status: AC
  Administered 2015-11-10: 25 mg via INTRAVENOUS
  Filled 2015-11-10: qty 1

## 2015-11-10 MED ORDER — METOCLOPRAMIDE HCL 5 MG/ML IJ SOLN
10.0000 mg | Freq: Once | INTRAMUSCULAR | Status: AC
  Administered 2015-11-10: 10 mg via INTRAVENOUS
  Filled 2015-11-10: qty 2

## 2015-11-10 NOTE — ED Notes (Signed)
Teaching done with Pt. On medications and reasons she should take when prescribed to her.  Pt. Reports she does not take meds due to nothing works.  RN explained to Pt. Nothing is a quick fix and she will have to continue a therapy to have a plan for relief.

## 2015-11-10 NOTE — ED Notes (Addendum)
Patient called out needing to use the restroom, EMT at bedside offered assistance to restroom, EMT informed patient she was up for discharge and offered her assistance to get ready, patient states "I can't be because I'm too sick, I want to speak with the nurse,"  Nurse informed.

## 2015-11-10 NOTE — Discharge Instructions (Signed)

## 2015-11-10 NOTE — ED Notes (Signed)
Patient denies pain and is resting comfortably.  

## 2015-11-10 NOTE — ED Notes (Signed)
Vomiting x 3 days. States she was just seen by her MD today for constipation. She is scheduled for an endoscopy tomorrow.

## 2015-11-10 NOTE — ED Provider Notes (Signed)
CSN: [id number]     Arrival date & time 11/10/15  1437 History   First MD Initiated Contact with Patient 11/10/15 1521     Chief Complaint  Patient presents with  . Emesis     (Consider location/radiation/quality/duration/timing/severity/associated sxs/prior Treatment) HPI Patient poor she's had problems with chronic pain and vomiting due to gastric ulcers and constipation. This has been a long-term problem. She reports that she saw her gastroenterologist this week and was told she had a lot of stool in her bowel. She reports she tried a suppository but has not had very much bowel movement. She reports what she had was old and hard. Patient poor she is due for an upper endoscopy tomorrow. She reports she needs to get some fluids and nausea medicine today. She has not had any fever. She reports multiple episodes of vomiting. Pain is diffuse, burning and cramping. Patient poor she is currently on her menstrual cycle. This is a normal cycle for her. Family history: A sharp reports no family history of ulcerative colitis or Crohn's disease. She does report her mother and sisters have had some other bowel problems.  Social history: Patient denies any narcotic use, no alcohol use: No smoking. Past Medical History  Diagnosis Date  . Ulcer of the stomach and intestine    Past Surgical History  Procedure Laterality Date  . Cesarean section    . Tubal ligation     No family history on file. Social History  Substance Use Topics  . Smoking status: Never Smoker   . Smokeless tobacco: None  . Alcohol Use: No   OB History    No data available     Review of Systems  10 Systems reviewed and are negative for acute change except as noted in the HPI.   Allergies  Penicillins  Home Medications   Prior to Admission medications   Medication Sig Start Date End Date Taking? Authorizing Provider  dicyclomine (BENTYL) 20 MG tablet Take 1 tablet (20 mg total) by mouth 2 (two) times daily. 01/20/15    Noland Fordyce, PA-C  HYDROcodone-acetaminophen (NORCO/VICODIN) 5-325 MG per tablet Take 1 tablet by mouth every 4 (four) hours as needed. Patient taking differently: Take 1 tablet by mouth every 4 (four) hours as needed for moderate pain.  04/01/15   Kristen N Ward, DO  omeprazole (PRILOSEC) 40 MG capsule Take 1 capsule (40 mg total) by mouth daily. 04/01/15   Kristen N Ward, DO  promethazine (PHENERGAN) 25 MG tablet Take 1 tablet (25 mg total) by mouth every 6 (six) hours as needed for nausea or vomiting. 05/30/15   Shela Leff, MD  sucralfate (CARAFATE) 1 G tablet Take 1 tablet (1 g total) by mouth 4 (four) times daily -  with meals and at bedtime. 04/01/15   Kristen N Ward, DO   BP 124/75 mmHg  Pulse 82  Temp(Src) 98.1 F (36.7 C) (Oral)  Resp 18  Ht 5\' 5"  (1.651 m)  Wt 136 lb (61.689 kg)  BMI 22.63 kg/m2  SpO2 100%  LMP 11/07/2015 Physical Exam  Constitutional: She is oriented to person, place, and time. She appears well-developed and well-nourished.  Patient is well in appearance. She has no restaurant stress. She is nontoxic in appearance. She is moderately agitated and holding her abdomen and rocking back-and-forth and moving her legs quite a bit.  HENT:  Head: Normocephalic and atraumatic.  Eyes: EOM are normal. Pupils are equal, round, and reactive to light. No scleral icterus.  Neck:  Neck supple.  Cardiovascular: Normal rate, regular rhythm, normal heart sounds and intact distal pulses.   Pulmonary/Chest: Effort normal and breath sounds normal.  Abdominal: Soft. Bowel sounds are normal. She exhibits no distension. There is tenderness.  Patient endorses tenderness to palpation particularly the upper abdomen. Of note however her abdomen is completely soft to palpation. No palpable mass. Patient changes position frequently without difficulty. She goes from supine to sitting without appearance of increased pain or difficulty.  Genitourinary:  Rectal examination: Rectum is empty.  Normal palpation. Normal anal area. No fissures or hemorrhoids.  Musculoskeletal: Normal range of motion. She exhibits no edema.  Neurological: She is alert and oriented to person, place, and time. She has normal strength. Coordination normal. GCS eye subscore is 4. GCS verbal subscore is 5. GCS motor subscore is 6.  Skin: Skin is warm, dry and intact.  Psychiatric:  Patient is moderately agitated but alert and appropriate.    ED Course  Procedures (including critical care time) Labs Review Labs Reviewed  COMPREHENSIVE METABOLIC PANEL - Abnormal; Notable for the following:    Potassium 3.4 (*)    Glucose, Bld 124 (*)    All other components within normal limits  URINALYSIS, ROUTINE W REFLEX MICROSCOPIC (NOT AT [facility]) - Abnormal; Notable for the following:    APPearance CLOUDY (*)    pH 8.5 (*)    Protein, ur 30 (*)    All other components within normal limits  URINE MICROSCOPIC-ADD ON - Abnormal; Notable for the following:    Squamous Epithelial / LPF 6-30 (*)    Bacteria, UA RARE (*)    All other components within normal limits  LIPASE, BLOOD  CBC WITH DIFFERENTIAL/PLATELET  PREGNANCY, URINE    Imaging Review No results found. I have personally reviewed and evaluated these images and lab results as part of my medical decision-making.   EKG Interpretation None      MDM   Final diagnoses:  Non-intractable vomiting with nausea, vomiting of unspecified type  Generalized abdominal pain   Patient appears to have some degree of chronic abdominal pain and vomiting. She describes history of ulcers and constipation. Diagnostic studies and vital signs are within normal limits. Patient's abdominal examination is soft without any guarding or palpable mass. She has follow-up tomorrow with gastroenterology.    Charlesetta Shanks, MD 11/10/15 [phone]

## 2016-03-20 ENCOUNTER — Emergency Department (HOSPITAL_BASED_OUTPATIENT_CLINIC_OR_DEPARTMENT_OTHER)
Admission: EM | Admit: 2016-03-20 | Discharge: 2016-03-20 | Disposition: A | Discharge status: Home / Self Care | Payer: Self-pay | Attending: Emergency Medicine | Admitting: Emergency Medicine

## 2016-03-20 ENCOUNTER — Encounter (HOSPITAL_BASED_OUTPATIENT_CLINIC_OR_DEPARTMENT_OTHER): Payer: Self-pay | Admitting: *Deleted

## 2016-03-20 ENCOUNTER — Emergency Department (HOSPITAL_BASED_OUTPATIENT_CLINIC_OR_DEPARTMENT_OTHER): Payer: Self-pay

## 2016-03-20 DIAGNOSIS — Z3202 Encounter for pregnancy test, result negative: Secondary | ICD-10-CM | POA: Insufficient documentation

## 2016-03-20 DIAGNOSIS — Z79899 Other long term (current) drug therapy: Secondary | ICD-10-CM | POA: Insufficient documentation

## 2016-03-20 DIAGNOSIS — K5900 Constipation, unspecified: Secondary | ICD-10-CM | POA: Insufficient documentation

## 2016-03-20 DIAGNOSIS — R1110 Vomiting, unspecified: Secondary | ICD-10-CM

## 2016-03-20 DIAGNOSIS — R1084 Generalized abdominal pain: Secondary | ICD-10-CM | POA: Insufficient documentation

## 2016-03-20 DIAGNOSIS — Z880 Allergy status to penicillin: Secondary | ICD-10-CM | POA: Insufficient documentation

## 2016-03-20 DIAGNOSIS — R630 Anorexia: Secondary | ICD-10-CM | POA: Insufficient documentation

## 2016-03-20 DIAGNOSIS — E860 Dehydration: Secondary | ICD-10-CM | POA: Insufficient documentation

## 2016-03-20 HISTORY — DX: Constipation, unspecified: K59.00

## 2016-03-20 LAB — URINALYSIS, ROUTINE W REFLEX MICROSCOPIC
Glucose, UA: NEGATIVE mg/dL
Leukocytes, UA: NEGATIVE
NITRITE: NEGATIVE
PROTEIN: 30 mg/dL — AB
Specific Gravity, Urine: 1.022 (ref 1.005–1.030)
pH: 7.5 (ref 5.0–8.0)

## 2016-03-20 LAB — COMPREHENSIVE METABOLIC PANEL
ALK PHOS: 79 U/L (ref 38–126)
ALT: 11 U/L — ABNORMAL LOW (ref 14–54)
ANION GAP: 16 — AB (ref 5–15)
AST: 17 U/L (ref 15–41)
Albumin: 5 g/dL (ref 3.5–5.0)
BILIRUBIN TOTAL: 1.8 mg/dL — AB (ref 0.3–1.2)
BUN: 15 mg/dL (ref 6–20)
CALCIUM: 9.9 mg/dL (ref 8.9–10.3)
CO2: 22 mmol/L (ref 22–32)
Chloride: 97 mmol/L — ABNORMAL LOW (ref 101–111)
Creatinine, Ser: 0.72 mg/dL (ref 0.44–1.00)
GFR calc non Af Amer: >60 mL/min (ref >60)
Glucose, Bld: 113 mg/dL — ABNORMAL HIGH (ref 65–99)
POTASSIUM: 3.4 mmol/L — AB (ref 3.5–5.1)
SODIUM: 135 mmol/L (ref 135–145)
TOTAL PROTEIN: 8.8 g/dL — AB (ref 6.5–8.1)

## 2016-03-20 LAB — CBC WITH DIFFERENTIAL/PLATELET
Basophils Absolute: 0 10*3/uL (ref 0.0–0.1)
Basophils Relative: 0 %
EOS PCT: 0 %
Eosinophils Absolute: 0 10*3/uL (ref 0.0–0.7)
HEMATOCRIT: 44.8 % (ref 36.0–46.0)
HEMOGLOBIN: 16.5 g/dL — AB (ref 12.0–15.0)
LYMPHS ABS: 1.4 10*3/uL (ref 0.7–4.0)
LYMPHS PCT: 13 %
MCH: 32 pg (ref 26.0–34.0)
MCHC: 36.8 g/dL — AB (ref 30.0–36.0)
MCV: 87 fL (ref 78.0–100.0)
Monocytes Absolute: 0.7 10*3/uL (ref 0.1–1.0)
Monocytes Relative: 6 %
NEUTROS ABS: 8.6 10*3/uL — AB (ref 1.7–7.7)
Neutrophils Relative %: 80 %
Platelets: 415 10*3/uL — ABNORMAL HIGH (ref 150–400)
RBC: 5.15 MIL/uL — ABNORMAL HIGH (ref 3.87–5.11)
RDW: 12.8 % (ref 11.5–15.5)
WBC: 10.7 10*3/uL — AB (ref 4.0–10.5)

## 2016-03-20 LAB — PREGNANCY, URINE: PREG TEST UR: NEGATIVE

## 2016-03-20 LAB — URINE MICROSCOPIC-ADD ON

## 2016-03-20 LAB — LIPASE, BLOOD: Lipase: 18 U/L (ref 11–51)

## 2016-03-20 LAB — I-STAT CG4 LACTIC ACID, ED: LACTIC ACID, VENOUS: 1.83 mmol/L (ref 0.5–2.0)

## 2016-03-20 MED ORDER — PROMETHAZINE HCL 25 MG RE SUPP: 25.0000 mg | Freq: Four times a day (QID) | RECTAL | Status: DC | PRN

## 2016-03-20 MED ORDER — ONDANSETRON 4 MG PO TBDP: 4.0000 mg | ORAL_TABLET | Freq: Three times a day (TID) | ORAL | Status: DC | PRN

## 2016-03-20 MED ORDER — DICYCLOMINE HCL 10 MG/ML IM SOLN
20.0000 mg | Freq: Once | INTRAMUSCULAR | Status: AC
  Administered 2016-03-20: 20 mg via INTRAMUSCULAR
  Filled 2016-03-20: qty 2

## 2016-03-20 MED ORDER — DEXTROSE 5 % IV BOLUS
1000.0000 mL | Freq: Once | INTRAVENOUS | Status: AC
  Administered 2016-03-20: 1000 mL via INTRAVENOUS

## 2016-03-20 MED ORDER — SODIUM CHLORIDE 0.9 % IV BOLUS (SEPSIS)
1000.0000 mL | Freq: Once | INTRAVENOUS | Status: AC
  Administered 2016-03-20: 1000 mL via INTRAVENOUS

## 2016-03-20 MED ORDER — FAMOTIDINE IN NACL 20-0.9 MG/50ML-% IV SOLN
20.0000 mg | Freq: Once | INTRAVENOUS | Status: AC
  Administered 2016-03-20: 20 mg via INTRAVENOUS
  Filled 2016-03-20: qty 50

## 2016-03-20 MED ORDER — HYDROMORPHONE HCL 1 MG/ML IJ SOLN
1.0000 mg | Freq: Once | INTRAMUSCULAR | Status: AC
  Administered 2016-03-20: 1 mg via INTRAVENOUS
  Filled 2016-03-20: qty 1

## 2016-03-20 MED ORDER — ONDANSETRON HCL 4 MG/2ML IJ SOLN
4.0000 mg | Freq: Once | INTRAMUSCULAR | Status: AC
  Administered 2016-03-20: 4 mg via INTRAVENOUS
  Filled 2016-03-20: qty 2

## 2016-03-20 MED FILL — ONDANSETRON ODT 4 MG TABLET: 4 | 7 days supply | Qty: 20 | Fill #0

## 2016-03-20 MED FILL — PHENADOZ 25 MG SUPP: 25 | 3 days supply | Qty: 12 | Fill #0

## 2016-03-20 NOTE — ED Provider Notes (Addendum)
CSN: [id number]     Arrival date & time 03/20/16  1058 History   First MD Initiated Contact with Patient 03/20/16 1106     Chief Complaint  Patient presents with  . Abdominal Pain  . Emesis     (Consider location/radiation/quality/duration/timing/severity/associated sxs/prior Treatment) Patient is a 40 y.o. female presenting with abdominal pain and vomiting. The history is provided by the patient.  Abdominal Pain Pain location:  Generalized Pain quality: aching, burning and cramping   Pain radiates to:  Does not radiate Pain severity:  Severe Onset quality:  Gradual Duration:  2 weeks Timing:  Constant Progression:  Worsening Chronicity:  Recurrent Context: diet changes   Context: not recent travel, not sick contacts and not suspicious food intake   Context comment:  Symptoms started after eating a large Easter dinner Relieved by:  Nothing Worsened by:  Eating Ineffective treatments:  Antacids, eating and liquids Associated symptoms: anorexia, belching, constipation, nausea and vomiting   Associated symptoms: no cough, no diarrhea, no dysuria, no fever, no shortness of breath, no vaginal bleeding and no vaginal discharge   Risk factors: no alcohol abuse, has not had multiple surgeries, no NSAID use, not pregnant and no recent hospitalization   Emesis Associated symptoms: abdominal pain   Associated symptoms: no diarrhea     Past Medical History  Diagnosis Date  . Ulcer of the stomach and intestine   . Constipation    Past Surgical History  Procedure Laterality Date  . Cesarean section    . Tubal ligation     No family history on file. Social History  Substance Use Topics  . Smoking status: Never Smoker   . Smokeless tobacco: None  . Alcohol Use: No   OB History    No data available     Review of Systems  Constitutional: Negative for fever.  Respiratory: Negative for cough and shortness of breath.   Gastrointestinal: Positive for nausea, vomiting, abdominal  pain, constipation and anorexia. Negative for diarrhea.  Genitourinary: Negative for dysuria, vaginal bleeding and vaginal discharge.  All other systems reviewed and are negative.     Allergies  Penicillins  Home Medications   Prior to Admission medications   Medication Sig Start Date End Date Taking? Authorizing Provider  polyethylene glycol (MIRALAX / GLYCOLAX) packet Take 17 g by mouth daily.   Yes Historical Provider, MD  dicyclomine (BENTYL) 20 MG tablet Take 1 tablet (20 mg total) by mouth 2 (two) times daily. 01/20/15   Noland Fordyce, PA-C  HYDROcodone-acetaminophen (NORCO/VICODIN) 5-325 MG per tablet Take 1 tablet by mouth every 4 (four) hours as needed. Patient taking differently: Take 1 tablet by mouth every 4 (four) hours as needed for moderate pain.  04/01/15   Kristen N Ward, DO  omeprazole (PRILOSEC) 40 MG capsule Take 1 capsule (40 mg total) by mouth daily. 04/01/15   Kristen N Ward, DO  promethazine (PHENERGAN) 25 MG tablet Take 1 tablet (25 mg total) by mouth every 6 (six) hours as needed for nausea or vomiting. 05/30/15   Shela Leff, MD  sucralfate (CARAFATE) 1 G tablet Take 1 tablet (1 g total) by mouth 4 (four) times daily -  with meals and at bedtime. 04/01/15   Kristen N Ward, DO   BP 117/88 mmHg  Pulse 92  Temp(Src) 98.5 F (36.9 C) (Oral)  Resp 16  Ht 5\' 5"  (1.651 m)  Wt 136 lb (61.689 kg)  BMI 22.63 kg/m2  SpO2 100%  LMP 03/19/2016 Physical Exam  Constitutional: She is oriented to person, place, and time. She appears well-developed and well-nourished. She appears distressed.  Appears uncomfortable  HENT:  Head: Normocephalic and atraumatic.  Mouth/Throat: Oropharynx is clear and moist. Mucous membranes are dry.  Eyes: Conjunctivae and EOM are normal. Pupils are equal, round, and reactive to light.  Neck: Normal range of motion. Neck supple.  Cardiovascular: Normal rate, regular rhythm and intact distal pulses.   No murmur heard. Pulmonary/Chest: Effort  normal and breath sounds normal. No respiratory distress. She has no wheezes. She has no rales.  Abdominal: Soft. Bowel sounds are normal. She exhibits no distension. There is generalized tenderness. There is no rebound and no guarding.  Musculoskeletal: Normal range of motion. She exhibits no edema or tenderness.  Neurological: She is alert and oriented to person, place, and time.  Skin: Skin is warm and dry. No rash noted. No erythema.  Psychiatric: She has a normal mood and affect. Her behavior is normal.  Nursing note and vitals reviewed.   ED Course  Procedures (including critical care time) Labs Review Labs Reviewed  CBC WITH DIFFERENTIAL/PLATELET - Abnormal; Notable for the following:    WBC 10.7 (*)    RBC 5.15 (*)    Hemoglobin 16.5 (*)    MCHC 36.8 (*)    Platelets 415 (*)    Neutro Abs 8.6 (*)    All other components within normal limits  COMPREHENSIVE METABOLIC PANEL - Abnormal; Notable for the following:    Potassium 3.4 (*)    Chloride 97 (*)    Glucose, Bld 113 (*)    Total Protein 8.8 (*)    ALT 11 (*)    Total Bilirubin 1.8 (*)    Anion gap 16 (*)    All other components within normal limits  URINALYSIS, ROUTINE W REFLEX MICROSCOPIC (NOT AT [facility]) - Abnormal; Notable for the following:    Color, Urine AMBER (*)    Hgb urine dipstick LARGE (*)    Bilirubin Urine SMALL (*)    Ketones, ur >80 (*)    Protein, ur 30 (*)    All other components within normal limits  URINE MICROSCOPIC-ADD ON - Abnormal; Notable for the following:    Squamous Epithelial / LPF 0-5 (*)    Bacteria, UA MANY (*)    All other components within normal limits  LIPASE, BLOOD  PREGNANCY, URINE  I-STAT CG4 LACTIC ACID, ED    Imaging Review Dg Abd 2 Views  03/20/2016  CLINICAL DATA:  Nausea and vomiting for 1 week and a half EXAM: ABDOMEN - 2 VIEW COMPARISON:  None. FINDINGS: Normal small bowel gas pattern. Some colonic gas noted in hepatic and splenic flexure of the colon. Pelvic  phleboliths are noted. No significant colonic stool. No evidence of free abdominal air. IMPRESSION: Normal small bowel gas pattern. Some colonic gas noted in hepatic and splenic flexure of the colon. No significant colonic stool. Electronically Signed   By: Lahoma Crocker M.D.   On: 03/20/2016 12:48   I have personally reviewed and evaluated these images and lab results as part of my medical decision-making.   EKG Interpretation None      MDM   Final diagnoses:  Vomiting  Dehydration  Patient is a 40 year old female presenting with ongoing upper abdominal pain, nausea and vomiting for the last several weeks. This happens intermittently regularly she did see a gastroenterologist this year who told her she had a hiatal hernia may have a food allergy. All her symptoms  started after eating Easter dinner. She states now she is unable to even hold down Gatorade and has attempted to hold down water. She is vomiting numerous times per day and has not had a bowel movement in days. She complains of excessive gas and is passing flatus. She denies any recent weight loss or fever. She describes this as the exact same symptoms that she's had in the past.  Patient's abdomen is soft and diffusely tender most localized in the epigastric area. CBC, CMP, lipase, lactate, UA and UPT pending. Patient given IV fluid, Bentyl, pain, nausea medication and Pepcid.  12:22 PM Labs without significant findings except for an anion gap of 16 and greater than 80 ketones in her urine. No evidence of pancreatitis and the rest of the labs are stable compared to prior tracings. Patient will continue to be hydrated. KUB pending. Low suspicion for obstruction at this time. Once hydrated will attempt to by mouth challenge.  2:39 PM Pt feeling much better and tolerating po's.  Will d/c hom.e  Blanchie Dessert, MD 03/20/16 1439  Blanchie Dessert, MD 03/20/16 1441  Blanchie Dessert, MD 03/20/16 1442

## 2016-03-20 NOTE — ED Notes (Signed)
Abdominal pain and vomiting. States she has been taking Miralax for constipation.

## 2016-04-24 ENCOUNTER — Emergency Department (HOSPITAL_BASED_OUTPATIENT_CLINIC_OR_DEPARTMENT_OTHER)
Admission: EM | Admit: 2016-04-24 | Discharge: 2016-04-24 | Disposition: A | Discharge status: Home / Self Care | Payer: MEDICAID | Attending: Emergency Medicine | Admitting: Emergency Medicine

## 2016-04-24 ENCOUNTER — Encounter (HOSPITAL_BASED_OUTPATIENT_CLINIC_OR_DEPARTMENT_OTHER): Payer: Self-pay | Admitting: *Deleted

## 2016-04-24 DIAGNOSIS — R112 Nausea with vomiting, unspecified: Secondary | ICD-10-CM

## 2016-04-24 DIAGNOSIS — Z79899 Other long term (current) drug therapy: Secondary | ICD-10-CM | POA: Insufficient documentation

## 2016-04-24 DIAGNOSIS — R1084 Generalized abdominal pain: Secondary | ICD-10-CM

## 2016-04-24 LAB — CBC WITH DIFFERENTIAL/PLATELET
Basophils Absolute: 0 10*3/uL (ref 0.0–0.1)
Basophils Relative: 0 %
Eosinophils Absolute: 0 10*3/uL (ref 0.0–0.7)
Eosinophils Relative: 0 %
HCT: 39.6 % (ref 36.0–46.0)
Hemoglobin: 14 g/dL (ref 12.0–15.0)
Lymphocytes Relative: 10 %
Lymphs Abs: 0.9 10*3/uL (ref 0.7–4.0)
MCH: 32.1 pg (ref 26.0–34.0)
MCHC: 35.4 g/dL (ref 30.0–36.0)
MCV: 90.8 fL (ref 78.0–100.0)
Monocytes Absolute: 0.4 10*3/uL (ref 0.1–1.0)
Monocytes Relative: 4 %
Neutro Abs: 8.4 10*3/uL — ABNORMAL HIGH (ref 1.7–7.7)
Neutrophils Relative %: 86 %
Platelets: 371 10*3/uL (ref 150–400)
RBC: 4.36 MIL/uL (ref 3.87–5.11)
RDW: 13 % (ref 11.5–15.5)
WBC: 9.7 10*3/uL (ref 4.0–10.5)

## 2016-04-24 LAB — URINALYSIS, ROUTINE W REFLEX MICROSCOPIC
Bilirubin Urine: NEGATIVE
Glucose, UA: NEGATIVE mg/dL
Hgb urine dipstick: NEGATIVE
Ketones, ur: >80 mg/dL — AB
Leukocytes, UA: NEGATIVE
Nitrite: NEGATIVE
Protein, ur: 30 mg/dL — AB
Specific Gravity, Urine: 1.022 (ref 1.005–1.030)
pH: 8 (ref 5.0–8.0)

## 2016-04-24 LAB — COMPREHENSIVE METABOLIC PANEL
ALT: 16 U/L (ref 14–54)
AST: 19 U/L (ref 15–41)
Albumin: 4.8 g/dL (ref 3.5–5.0)
Alkaline Phosphatase: 64 U/L (ref 38–126)
Anion gap: 10 (ref 5–15)
BUN: 13 mg/dL (ref 6–20)
CO2: 26 mmol/L (ref 22–32)
Calcium: 10.1 mg/dL (ref 8.9–10.3)
Chloride: 102 mmol/L (ref 101–111)
Creatinine, Ser: 0.67 mg/dL (ref 0.44–1.00)
GFR calc Af Amer: >60 mL/min (ref >60)
GFR calc non Af Amer: >60 mL/min (ref >60)
Glucose, Bld: 131 mg/dL — ABNORMAL HIGH (ref 65–99)
Potassium: 3.4 mmol/L — ABNORMAL LOW (ref 3.5–5.1)
Sodium: 138 mmol/L (ref 135–145)
Total Bilirubin: 1.2 mg/dL (ref 0.3–1.2)
Total Protein: 8.4 g/dL — ABNORMAL HIGH (ref 6.5–8.1)

## 2016-04-24 LAB — PREGNANCY, URINE: Preg Test, Ur: NEGATIVE

## 2016-04-24 LAB — URINE MICROSCOPIC-ADD ON

## 2016-04-24 LAB — LIPASE, BLOOD: Lipase: 16 U/L (ref 11–51)

## 2016-04-24 MED ORDER — SODIUM CHLORIDE 0.9 % IV BOLUS (SEPSIS)
1000.0000 mL | Freq: Once | INTRAVENOUS | Status: AC
  Administered 2016-04-24: 1000 mL via INTRAVENOUS

## 2016-04-24 MED ORDER — ONDANSETRON HCL 4 MG/2ML IJ SOLN
4.0000 mg | Freq: Once | INTRAMUSCULAR | Status: AC
  Administered 2016-04-24: 4 mg via INTRAVENOUS
  Filled 2016-04-24: qty 2

## 2016-04-24 MED ORDER — GI COCKTAIL ~~LOC~~
30.0000 mL | Freq: Once | ORAL | Status: AC
  Administered 2016-04-24: 30 mL via ORAL
  Filled 2016-04-24: qty 30

## 2016-04-24 MED ORDER — MORPHINE SULFATE (PF) 4 MG/ML IV SOLN
4.0000 mg | Freq: Once | INTRAVENOUS | Status: AC
  Administered 2016-04-24: 4 mg via INTRAVENOUS
  Filled 2016-04-24: qty 1

## 2016-04-24 NOTE — ED Notes (Signed)
Pt request apple juice. Dr Wilson Singer aware. Apple juice given.

## 2016-04-24 NOTE — Discharge Instructions (Signed)
Abdominal Pain, Adult Many things can cause abdominal pain. Usually, abdominal pain is not caused by a disease and will improve without treatment. It can often be observed and treated at home. Your health care provider will do a physical exam and possibly order blood tests and X-rays to help determine the seriousness of your pain. However, in many cases, more time must pass before a clear cause of the pain can be found. Before that point, your health care provider may not know if you need more testing or further treatment. HOME CARE INSTRUCTIONS Monitor your abdominal pain for any changes. The following actions may help to alleviate any discomfort you are experiencing:  Only take over-the-counter or prescription medicines as directed by your health care provider.  Do not take laxatives unless directed to do so by your health care provider.  Try a clear liquid diet (broth, tea, or water) as directed by your health care provider. Slowly move to a bland diet as tolerated. SEEK MEDICAL CARE IF:  You have unexplained abdominal pain.  You have abdominal pain associated with nausea or diarrhea.  You have pain when you urinate or have a bowel movement.  You experience abdominal pain that wakes you in the night.  You have abdominal pain that is worsened or improved by eating food.  You have abdominal pain that is worsened with eating fatty foods.  You have a fever. SEEK IMMEDIATE MEDICAL CARE IF:  Your pain does not go away within 2 hours.  You keep throwing up (vomiting).  Your pain is felt only in portions of the abdomen, such as the right side or the left lower portion of the abdomen.  You pass bloody or black tarry stools. MAKE SURE YOU:  Understand these instructions.  Will watch your condition.  Will get help right away if you are not doing well or get worse.   This information is not intended to replace advice given to you by your health care provider. Make sure you discuss  any questions you have with your health care provider.   Document Released: 08/22/2005 Document Revised: 08/03/2015 Document Reviewed: 07/22/2013 Elsevier Interactive Patient Education 2016 [employer]  Nausea and Vomiting Nausea means you feel sick to your stomach. Throwing up (vomiting) is a reflex where stomach contents come out of your mouth. HOME CARE   Take medicine as told by your doctor.  Do not force yourself to eat. However, you do need to drink fluids.  If you feel like eating, eat a normal diet as told by your doctor.  Eat rice, wheat, potatoes, bread, lean meats, yogurt, fruits, and vegetables.  Avoid high-fat foods.  Drink enough fluids to keep your pee (urine) clear or pale yellow.  Ask your doctor how to replace body fluid losses (rehydrate). Signs of body fluid loss (dehydration) include:  Feeling very thirsty.  Dry lips and mouth.  Feeling dizzy.  Dark pee.  Peeing less than normal.  Feeling confused.  Fast breathing or heart rate. GET HELP RIGHT AWAY IF:   You have blood in your throw up.  You have black or bloody poop (stool).  You have a bad headache or stiff neck.  You feel confused.  You have bad belly (abdominal) pain.  You have chest pain or trouble breathing.  You do not pee at least once every 8 hours.  You have cold, clammy skin.  You keep throwing up after 24 to 48 hours.  You have a fever. MAKE SURE YOU:  Understand these instructions.  Will watch your condition.  Will get help right away if you are not doing well or get worse.   This information is not intended to replace advice given to you by your health care provider. Make sure you discuss any questions you have with your health care provider.   Document Released: 04/30/2008 Document Revised: 02/04/2012 Document Reviewed: 04/13/2011 Elsevier Interactive Patient Education [employer].

## 2016-04-24 NOTE — ED Notes (Signed)
C/o hx of ulcers. C/o constipation and n/v. Onset yesterday. No fever.

## 2016-04-25 NOTE — ED Provider Notes (Signed)
CSN: [id number]     Arrival date & time 04/24/16  0719 History   First MD Initiated Contact with Patient 04/24/16 [phone]     No chief complaint on file.    (Consider location/radiation/quality/duration/timing/severity/associated sxs/prior Treatment) HPI   40 year old female with nausea, vomiting abdominal pain. Symptom onset yesterday. Describes crampy abdominal pain. Diffuse, but worse in upper abdomen. Waxes and wanes. No appreciable exacerbating relieving factors. Multiple episodes of nonbilious, nonbloody vomiting. No urinary complaints. No fevers or chills. No sick contacts.  Past Medical History  Diagnosis Date  . Ulcer of the stomach and intestine   . Constipation    Past Surgical History  Procedure Laterality Date  . Cesarean section    . Tubal ligation     No family history on file. Social History  Substance Use Topics  . Smoking status: Never Smoker   . Smokeless tobacco: None  . Alcohol Use: No   OB History    No data available     Review of Systems  All systems reviewed and negative, other than as noted in HPI.   Allergies  Penicillins  Home Medications   Prior to Admission medications   Medication Sig Start Date End Date Taking? Authorizing Provider  esomeprazole (NEXIUM) 20 MG capsule Take 20 mg by mouth daily at 12 noon.   Yes Historical Provider, MD  ondansetron (ZOFRAN ODT) 4 MG disintegrating tablet Take 1 tablet (4 mg total) by mouth every 8 (eight) hours as needed for nausea or vomiting. 03/20/16  Yes Blanchie Dessert, MD  polyethylene glycol (MIRALAX / GLYCOLAX) packet Take 17 g by mouth daily.   Yes Historical Provider, MD  promethazine (PHENERGAN) 25 MG suppository Place 1 suppository (25 mg total) rectally every 6 (six) hours as needed for nausea or vomiting. 03/20/16  Yes Blanchie Dessert, MD   BP 129/73 mmHg  Pulse 62  Temp(Src) 98.4 F (36.9 C) (Oral)  Resp 20  Ht 5\' 5"  (1.651 m)  Wt 140 lb (63.504 kg)  BMI 23.30 kg/m2  SpO2 100%  LMP  04/15/2016 Physical Exam  Constitutional: She appears well-developed and well-nourished. No distress.  HENT:  Head: Normocephalic and atraumatic.  Eyes: Conjunctivae are normal. Right eye exhibits no discharge. Left eye exhibits no discharge.  Neck: Neck supple.  Cardiovascular: Normal rate, regular rhythm and normal heart sounds.  Exam reveals no gallop and no friction rub.   No murmur heard. Pulmonary/Chest: Effort normal and breath sounds normal. No respiratory distress.  Abdominal: Soft. She exhibits no distension. There is no tenderness.  Musculoskeletal: She exhibits no edema or tenderness.  Neurological: She is alert.  Skin: Skin is warm and dry.  Psychiatric: She has a normal mood and affect. Her behavior is normal. Thought content normal.  Nursing note and vitals reviewed.   ED Course  Procedures (including critical care time) Labs Review Labs Reviewed  CBC WITH DIFFERENTIAL/PLATELET - Abnormal; Notable for the following:    Neutro Abs 8.4 (*)    All other components within normal limits  COMPREHENSIVE METABOLIC PANEL - Abnormal; Notable for the following:    Potassium 3.4 (*)    Glucose, Bld 131 (*)    Total Protein 8.4 (*)    All other components within normal limits  URINALYSIS, ROUTINE W REFLEX MICROSCOPIC (NOT AT [facility]) - Abnormal; Notable for the following:    Ketones, ur >80 (*)    Protein, ur 30 (*)    All other components within normal limits  URINE MICROSCOPIC-ADD ON -  Abnormal; Notable for the following:    Squamous Epithelial / LPF 0-5 (*)    Bacteria, UA FEW (*)    All other components within normal limits  LIPASE, BLOOD  PREGNANCY, URINE    Imaging Review No results found. I have personally reviewed and evaluated these images and lab results as part of my medical decision-making.   EKG Interpretation None      MDM   Final diagnoses:  Generalized abdominal pain  Nausea and vomiting, intractability of vomiting not specified, unspecified  vomiting type    40 year old female with nausea, vomiting and crampy abdominal pain. Her abdominal exam is benign. She is afebrile. He dynamically stable. Workup fairly unremarkable aside from a large amount of ketones on urinalysis consistent with some dehydration. Symptoms markedly improved with medications and fluids. Low suspicion for emergent process. Return precautions were discussed. Outpatient follow-up as needed otherwise.    Virgel Manifold, MD 04/25/16 1026

## 2016-05-31 ENCOUNTER — Emergency Department (HOSPITAL_BASED_OUTPATIENT_CLINIC_OR_DEPARTMENT_OTHER)
Admission: EM | Admit: 2016-05-31 | Discharge: 2016-05-31 | Disposition: A | Discharge status: Home / Self Care | Payer: Self-pay | Attending: Emergency Medicine | Admitting: Emergency Medicine

## 2016-05-31 ENCOUNTER — Encounter (HOSPITAL_BASED_OUTPATIENT_CLINIC_OR_DEPARTMENT_OTHER): Payer: Self-pay | Admitting: *Deleted

## 2016-05-31 ENCOUNTER — Emergency Department (HOSPITAL_BASED_OUTPATIENT_CLINIC_OR_DEPARTMENT_OTHER): Payer: Self-pay

## 2016-05-31 DIAGNOSIS — R112 Nausea with vomiting, unspecified: Secondary | ICD-10-CM | POA: Insufficient documentation

## 2016-05-31 DIAGNOSIS — R1013 Epigastric pain: Secondary | ICD-10-CM | POA: Insufficient documentation

## 2016-05-31 LAB — CBC WITH DIFFERENTIAL/PLATELET
BASOS ABS: 0 10*3/uL (ref 0.0–0.1)
Basophils Relative: 0 %
EOS ABS: 0 10*3/uL (ref 0.0–0.7)
EOS PCT: 0 %
HEMATOCRIT: 39.1 % (ref 36.0–46.0)
Hemoglobin: 14.1 g/dL (ref 12.0–15.0)
LYMPHS ABS: 1.5 10*3/uL (ref 0.7–4.0)
LYMPHS PCT: 17 %
MCH: 32 pg (ref 26.0–34.0)
MCHC: 36.1 g/dL — ABNORMAL HIGH (ref 30.0–36.0)
MCV: 88.9 fL (ref 78.0–100.0)
MONO ABS: 0.5 10*3/uL (ref 0.1–1.0)
Monocytes Relative: 6 %
Neutro Abs: 6.5 10*3/uL (ref 1.7–7.7)
Neutrophils Relative %: 77 %
Platelets: 344 10*3/uL (ref 150–400)
RBC: 4.4 MIL/uL (ref 3.87–5.11)
RDW: 12.9 % (ref 11.5–15.5)
WBC: 8.5 10*3/uL (ref 4.0–10.5)

## 2016-05-31 LAB — COMPREHENSIVE METABOLIC PANEL
ALK PHOS: 54 U/L (ref 38–126)
ALT: 15 U/L (ref 14–54)
AST: 28 U/L (ref 15–41)
Albumin: 4.6 g/dL (ref 3.5–5.0)
Anion gap: 9 (ref 5–15)
BUN: 8 mg/dL (ref 6–20)
CALCIUM: 9.5 mg/dL (ref 8.9–10.3)
CHLORIDE: 105 mmol/L (ref 101–111)
CO2: 24 mmol/L (ref 22–32)
CREATININE: 0.78 mg/dL (ref 0.44–1.00)
Glucose, Bld: 114 mg/dL — ABNORMAL HIGH (ref 65–99)
Potassium: 3.4 mmol/L — ABNORMAL LOW (ref 3.5–5.1)
Sodium: 138 mmol/L (ref 135–145)
Total Bilirubin: 0.8 mg/dL (ref 0.3–1.2)
Total Protein: 7.7 g/dL (ref 6.5–8.1)

## 2016-05-31 LAB — LIPASE, BLOOD: LIPASE: 13 U/L (ref 11–51)

## 2016-05-31 LAB — PREGNANCY, URINE: Preg Test, Ur: NEGATIVE

## 2016-05-31 MED ORDER — HYDROMORPHONE HCL 1 MG/ML IJ SOLN
1.0000 mg | Freq: Once | INTRAMUSCULAR | Status: AC
  Administered 2016-05-31: 1 mg via INTRAVENOUS
  Filled 2016-05-31: qty 1

## 2016-05-31 MED ORDER — PROMETHAZINE HCL 25 MG/ML IJ SOLN
12.5000 mg | Freq: Once | INTRAMUSCULAR | Status: AC
  Administered 2016-05-31: 12.5 mg via INTRAVENOUS
  Filled 2016-05-31: qty 1

## 2016-05-31 MED ORDER — PROMETHAZINE HCL 25 MG/ML IJ SOLN
12.5000 mg | Freq: Four times a day (QID) | INTRAMUSCULAR | Status: DC | PRN
  Administered 2016-05-31: 12.5 mg via INTRAVENOUS
  Filled 2016-05-31: qty 1

## 2016-05-31 MED ORDER — SODIUM CHLORIDE 0.9 % IV BOLUS (SEPSIS)
1000.0000 mL | Freq: Once | INTRAVENOUS | Status: AC
Start: 2016-05-31 — End: 2016-05-31
  Administered 2016-05-31: 1000 mL via INTRAVENOUS

## 2016-05-31 MED ORDER — PROMETHAZINE HCL 12.5 MG PO TABS: 12.5000 mg | ORAL_TABLET | Freq: Four times a day (QID) | ORAL | Status: DC | PRN

## 2016-05-31 MED ORDER — SODIUM CHLORIDE 0.9 % IV SOLN
INTRAVENOUS | Status: DC
  Administered 2016-05-31: 17:00:00 via INTRAVENOUS

## 2016-05-31 MED ORDER — GI COCKTAIL ~~LOC~~
30.0000 mL | Freq: Once | ORAL | Status: AC
  Administered 2016-05-31: 30 mL via ORAL
  Filled 2016-05-31: qty 30

## 2016-05-31 MED ORDER — FAMOTIDINE 20 MG PO TABS: 20.0000 mg | ORAL_TABLET | Freq: Two times a day (BID) | ORAL | Status: DC

## 2016-05-31 NOTE — ED Notes (Signed)
Pt c/o diffuse abd pain x 3 days vomiting x 1 day

## 2016-05-31 NOTE — Discharge Instructions (Signed)
Take the medication Phenergan and Pepcid as directed. Take an appointment to follow-up with your regular doctor. Return for any new or worse symptoms. Today's labs and x-rays without any significant abnormalities.

## 2016-05-31 NOTE — ED Provider Notes (Signed)
CSN: [id number]     Arrival date & time 05/31/16  1523 History   First MD Initiated Contact with Patient 05/31/16 1537     Chief Complaint  Patient presents with  . Abdominal Pain     (Consider location/radiation/quality/duration/timing/severity/associated sxs/prior Treatment) Patient is a 40 y.o. female presenting with abdominal pain. The history is provided by the patient.  Abdominal Pain Associated symptoms: nausea and vomiting   Associated symptoms: no chest pain, no dysuria, no fever and no shortness of breath   40 year old female seen in April and May for similar epigastric abdominal pain with vomiting. Patient apparently followed by GI medicines been told she has a hiatal hernia as well as peptic ulcer disease. Patient has been on Nexium in the past. Patient states she's had multiple episodes of vomiting today. However has had the epigastric abdominal pain for 3 days. Patient denies any vomiting of blood. Abdominal pain is all epigastric. Similar to episodes in the past.   Past Medical History  Diagnosis Date  . Ulcer of the stomach and intestine   . Constipation    Past Surgical History  Procedure Laterality Date  . Cesarean section    . Tubal ligation     History reviewed. No pertinent family history. Social History  Substance Use Topics  . Smoking status: Never Smoker   . Smokeless tobacco: None  . Alcohol Use: No   OB History    No data available     Review of Systems  Constitutional: Negative for fever.  HENT: Negative for congestion.   Eyes: Negative for visual disturbance.  Respiratory: Negative for shortness of breath.   Cardiovascular: Negative for chest pain.  Gastrointestinal: Positive for nausea, vomiting and abdominal pain.  Genitourinary: Negative for dysuria.  Musculoskeletal: Negative for back pain.  Skin: Negative for rash.  Psychiatric/Behavioral: Negative for confusion.      Allergies  Penicillins  Home Medications   Prior to Admission  medications   Medication Sig Start Date End Date Taking? Authorizing Provider  esomeprazole (NEXIUM) 20 MG capsule Take 20 mg by mouth daily at 12 noon.    Historical Provider, MD  famotidine (PEPCID) 20 MG tablet Take 1 tablet (20 mg total) by mouth 2 (two) times daily. 05/31/16   Fredia Sorrow, MD  ondansetron (ZOFRAN ODT) 4 MG disintegrating tablet Take 1 tablet (4 mg total) by mouth every 8 (eight) hours as needed for nausea or vomiting. 03/20/16   Blanchie Dessert, MD  polyethylene glycol (MIRALAX / GLYCOLAX) packet Take 17 g by mouth daily.    Historical Provider, MD  promethazine (PHENERGAN) 12.5 MG tablet Take 1 tablet (12.5 mg total) by mouth every 6 (six) hours as needed for nausea or vomiting. 05/31/16   Fredia Sorrow, MD  promethazine (PHENERGAN) 25 MG suppository Place 1 suppository (25 mg total) rectally every 6 (six) hours as needed for nausea or vomiting. 03/20/16   Blanchie Dessert, MD   BP 142/99 mmHg  Pulse 85  Temp(Src) 98 F (36.7 C) (Oral)  Resp 20  Ht 5\' 4"  (1.626 m)  Wt 63.504 kg  BMI 24.02 kg/m2  SpO2 100%  LMP 05/03/2016 Physical Exam  Constitutional: She is oriented to person, place, and time. She appears well-developed and well-nourished. No distress.  HENT:  Head: Normocephalic and atraumatic.  Mouth/Throat: Oropharynx is clear and moist.  Eyes: Conjunctivae and EOM are normal. Pupils are equal, round, and reactive to light.  Neck: Normal range of motion. Neck supple.  Cardiovascular: Normal rate, regular  rhythm and normal heart sounds.   No murmur heard. Pulmonary/Chest: Effort normal and breath sounds normal. No respiratory distress.  Abdominal: Soft. Bowel sounds are normal. There is no tenderness.  Musculoskeletal: Normal range of motion.  Neurological: She is alert and oriented to person, place, and time. No cranial nerve deficit. She exhibits normal muscle tone. Coordination normal.  Skin: Skin is warm.  Nursing note and vitals reviewed.   ED  Course  Procedures (including critical care time) Labs Review Labs Reviewed  COMPREHENSIVE METABOLIC PANEL - Abnormal; Notable for the following:    Potassium 3.4 (*)    Glucose, Bld 114 (*)    All other components within normal limits  CBC WITH DIFFERENTIAL/PLATELET - Abnormal; Notable for the following:    MCHC 36.1 (*)    All other components within normal limits  LIPASE, BLOOD  PREGNANCY, URINE   Results for orders placed or performed during the hospital encounter of 05/31/16  Comprehensive metabolic panel  Result Value Ref Range   Sodium 138 135 - 145 mmol/L   Potassium 3.4 (L) 3.5 - 5.1 mmol/L   Chloride 105 101 - 111 mmol/L   CO2 24 22 - 32 mmol/L   Glucose, Bld 114 (H) 65 - 99 mg/dL   BUN 8 6 - 20 mg/dL   Creatinine, Ser 0.78 0.44 - 1.00 mg/dL   Calcium 9.5 8.9 - 10.3 mg/dL   Total Protein 7.7 6.5 - 8.1 g/dL   Albumin 4.6 3.5 - 5.0 g/dL   AST 28 15 - 41 U/L   ALT 15 14 - 54 U/L   Alkaline Phosphatase 54 38 - 126 U/L   Total Bilirubin 0.8 0.3 - 1.2 mg/dL   GFR calc non Af Amer >60 >60 mL/min   GFR calc Af Amer >60 >60 mL/min   Anion gap 9 5 - 15  Lipase, blood  Result Value Ref Range   Lipase 13 11 - 51 U/L  CBC with Differential/Platelet  Result Value Ref Range   WBC 8.5 4.0 - 10.5 K/uL   RBC 4.40 3.87 - 5.11 MIL/uL   Hemoglobin 14.1 12.0 - 15.0 g/dL   HCT 39.1 36.0 - 46.0 %   MCV 88.9 78.0 - 100.0 fL   MCH 32.0 26.0 - 34.0 pg   MCHC 36.1 (H) 30.0 - 36.0 g/dL   RDW 12.9 11.5 - 15.5 %   Platelets 344 150 - 400 K/uL   Neutrophils Relative % 77 %   Neutro Abs 6.5 1.7 - 7.7 K/uL   Lymphocytes Relative 17 %   Lymphs Abs 1.5 0.7 - 4.0 K/uL   Monocytes Relative 6 %   Monocytes Absolute 0.5 0.1 - 1.0 K/uL   Eosinophils Relative 0 %   Eosinophils Absolute 0.0 0.0 - 0.7 K/uL   Basophils Relative 0 %   Basophils Absolute 0.0 0.0 - 0.1 K/uL  Pregnancy, urine  Result Value Ref Range   Preg Test, Ur NEGATIVE NEGATIVE     Imaging Review Dg Abd Acute  W/chest  05/31/2016  CLINICAL DATA:  Diffuse abdominal pain, 3 days duration. Vomiting over the last day. EXAM: DG ABDOMEN ACUTE W/ 1V CHEST COMPARISON:  03/20/2016 FINDINGS: Bowel gas pattern is normal without evidence of ileus, obstruction or free air. No abnormal calcifications or acute bone findings. Phleboliths in the pelvis. Transitional S1 vertebra with sacral articulations. One-view chest shows normal heart and mediastinal shadows. The lungs are clear. No free air. IMPRESSION: Negative abdominal radiographs.  No acute cardiopulmonary disease.  Electronically Signed   By: Nelson Chimes M.D.   On: 05/31/2016 19:37   I have personally reviewed and evaluated these images and lab results as part of my medical decision-making.   EKG Interpretation None      MDM   Final diagnoses:  Epigastric pain  Non-intractable vomiting with nausea, vomiting of unspecified type    Patient improved here with IV fluids and also hydromorphone and Phenergan. Workup without any acute findings. Pregnancy test negative no leukocytosis no significant electrolyte abnormalities. Abdominal x-rays without any acute findings. This is now the patient's third episode since April. She does have GI medicine to follow-up with periods possible this could be a cyclical vomiting type process she talks about having ulcers of the stomach so will be started on Pepcid along with the Phenergan.    Fredia Sorrow, MD 05/31/16 2003

## 2016-05-31 NOTE — ED Notes (Signed)
Patient transported to X-ray 

## 2016-06-02 ENCOUNTER — Emergency Department (HOSPITAL_BASED_OUTPATIENT_CLINIC_OR_DEPARTMENT_OTHER)
Admission: EM | Admit: 2016-06-02 | Discharge: 2016-06-02 | Disposition: A | Discharge status: Home / Self Care | Payer: MEDICAID | Attending: Emergency Medicine | Admitting: Emergency Medicine

## 2016-06-02 ENCOUNTER — Emergency Department (HOSPITAL_BASED_OUTPATIENT_CLINIC_OR_DEPARTMENT_OTHER): Payer: MEDICAID

## 2016-06-02 ENCOUNTER — Encounter (HOSPITAL_BASED_OUTPATIENT_CLINIC_OR_DEPARTMENT_OTHER): Payer: Self-pay | Admitting: *Deleted

## 2016-06-02 DIAGNOSIS — E876 Hypokalemia: Secondary | ICD-10-CM | POA: Insufficient documentation

## 2016-06-02 DIAGNOSIS — R1013 Epigastric pain: Secondary | ICD-10-CM | POA: Insufficient documentation

## 2016-06-02 DIAGNOSIS — R112 Nausea with vomiting, unspecified: Secondary | ICD-10-CM | POA: Insufficient documentation

## 2016-06-02 LAB — COMPREHENSIVE METABOLIC PANEL
ALT: 13 U/L — AB (ref 14–54)
AST: 21 U/L (ref 15–41)
Albumin: 4.1 g/dL (ref 3.5–5.0)
Alkaline Phosphatase: 48 U/L (ref 38–126)
Anion gap: 10 (ref 5–15)
BUN: 8 mg/dL (ref 6–20)
CHLORIDE: 102 mmol/L (ref 101–111)
CO2: 25 mmol/L (ref 22–32)
Calcium: 9.2 mg/dL (ref 8.9–10.3)
Creatinine, Ser: 0.74 mg/dL (ref 0.44–1.00)
Glucose, Bld: 110 mg/dL — ABNORMAL HIGH (ref 65–99)
POTASSIUM: 3.2 mmol/L — AB (ref 3.5–5.1)
SODIUM: 137 mmol/L (ref 135–145)
Total Bilirubin: 0.9 mg/dL (ref 0.3–1.2)
Total Protein: 7.2 g/dL (ref 6.5–8.1)

## 2016-06-02 LAB — CBC WITH DIFFERENTIAL/PLATELET
Basophils Absolute: 0 10*3/uL (ref 0.0–0.1)
Basophils Relative: 0 %
Eosinophils Absolute: 0 10*3/uL (ref 0.0–0.7)
Eosinophils Relative: 0 %
HEMATOCRIT: 37.5 % (ref 36.0–46.0)
HEMOGLOBIN: 13.6 g/dL (ref 12.0–15.0)
LYMPHS ABS: 1.7 10*3/uL (ref 0.7–4.0)
LYMPHS PCT: 16 %
MCH: 31.6 pg (ref 26.0–34.0)
MCHC: 36.3 g/dL — AB (ref 30.0–36.0)
MCV: 87.2 fL (ref 78.0–100.0)
MONOS PCT: 5 %
Monocytes Absolute: 0.5 10*3/uL (ref 0.1–1.0)
NEUTROS ABS: 8 10*3/uL — AB (ref 1.7–7.7)
NEUTROS PCT: 79 %
Platelets: 330 10*3/uL (ref 150–400)
RBC: 4.3 MIL/uL (ref 3.87–5.11)
RDW: 12.6 % (ref 11.5–15.5)
WBC: 10.2 10*3/uL (ref 4.0–10.5)

## 2016-06-02 LAB — LIPASE, BLOOD: LIPASE: 12 U/L (ref 11–51)

## 2016-06-02 MED ORDER — HYDROCODONE-ACETAMINOPHEN 5-325 MG PO TABS: 1.0000 | ORAL_TABLET | Freq: Four times a day (QID) | ORAL | Status: DC | PRN

## 2016-06-02 MED ORDER — IOPAMIDOL (ISOVUE-300) INJECTION 61%: 100.0000 mL | Freq: Once | INTRAVENOUS | Status: DC | PRN

## 2016-06-02 MED ORDER — SODIUM CHLORIDE 0.9 % IV BOLUS (SEPSIS)
2000.0000 mL | Freq: Once | INTRAVENOUS | Status: AC
Start: 2016-06-02 — End: 2016-06-02
  Administered 2016-06-02: 2000 mL via INTRAVENOUS

## 2016-06-02 MED ORDER — HYDROMORPHONE HCL 1 MG/ML IJ SOLN
1.0000 mg | Freq: Once | INTRAMUSCULAR | Status: AC
  Administered 2016-06-02: 1 mg via INTRAVENOUS
  Filled 2016-06-02: qty 1

## 2016-06-02 MED ORDER — METOCLOPRAMIDE HCL 5 MG/ML IJ SOLN
10.0000 mg | Freq: Once | INTRAMUSCULAR | Status: AC
  Administered 2016-06-02: 10 mg via INTRAVENOUS
  Filled 2016-06-02: qty 2

## 2016-06-02 MED ORDER — POTASSIUM CHLORIDE CRYS ER 20 MEQ PO TBCR
40.0000 meq | EXTENDED_RELEASE_TABLET | Freq: Once | ORAL | Status: AC
  Administered 2016-06-02: 40 meq via ORAL
  Filled 2016-06-02: qty 2

## 2016-06-02 MED ORDER — POTASSIUM CHLORIDE CRYS ER 20 MEQ PO TBCR
EXTENDED_RELEASE_TABLET | ORAL | Status: AC
  Filled 2016-06-02: qty 2

## 2016-06-02 MED ORDER — METOCLOPRAMIDE HCL 10 MG PO TABS: 10.0000 mg | ORAL_TABLET | Freq: Four times a day (QID) | ORAL | Status: DC | PRN

## 2016-06-02 NOTE — ED Notes (Signed)
Patient returned from CT with tech via stretcher 

## 2016-06-02 NOTE — Discharge Instructions (Signed)
Continue to take the Pepcid prescribed to you at your last visit here. Take Tylenol for mild pain or the pain medicine prescribed for bad pain. Don't take Tylenol together with the pain medicine prescribed as the combination can be dangerous to your liver. Avoid ibuprofen or the class of drugs known as NSAIDS, as that class of medicine can cause ulcers. Avoid alcohol. Sleep with your head elevated on 2 or 3 pillows. Take the medication prescribed as needed for nausea or vomiting. Contact your gastroenterologist in 2 days to schedule the next available appointment. Return if you feel worse for any reason or see your primary care physician or gastroenterologist.

## 2016-06-02 NOTE — ED Provider Notes (Addendum)
CSN: [id number]     Arrival date & time 06/02/16  1829 History  By signing my name below, I, Ephriam Jenkins, attest that this documentation has been prepared under the direction and in the presence of Orlie Dakin, MD. Electronically signed, Ephriam Jenkins, ED Scribe. 06/02/2016. 7:10 PM.     Chief Complaint  Patient presents with  . Abdominal Pain   The history is provided by the patient. No language interpreter was used.   HPI Comments: Tammy Pittman is a 40 y.o. female with a PMHx of sludge in her gallbladder, stomach ulcer, constipation, tubal ligation, hiatal hernia, who presents to the Emergency Department complaining of gradually worsening, upper abdominal pain that started 3 days ago. Pt was seen here yesterday for same symptoms and was given medications but still reports persistent upper abdominal pain, nausea and vomiting. Patient has continuous upper abdominal pain for several months, but become worse over the past 3 days. Pt states her pain is worse with vomiting. Pt reports pain is exacerbated while supine position and improved with sitting up. No fever. No hematemesis. No blood per rectum Pt states she has had around 20 episodes of vomiting in the past 24 hours and denies any hematemesis. Pt states her last bowel movement was yesterday after taking a fleet. Pt states her bowel movement before the fleet was two days ago. Pt has seen GI doctor at [facility] in [facility], last visit six months ago. Pt has a Hx of constipation. Pt denies any Hx of small bowel obstruction. Pt denies any fever, dysuria. Pt has had a cesarean section. Pt's LNMP was 3 weeks ago. Pt denies being a smoker, etOH use, drug use. Pt is allergic to penicillin. Pt was prescribed. Pt's PCP is Dr. Sharlet Salina in [facility].   Past Medical History  Diagnosis Date  . Ulcer of the stomach and intestine   . Constipation    Past Surgical History  Procedure Laterality Date  . Cesarean section    . Tubal ligation     History  reviewed. No pertinent family history. Social History  Substance Use Topics  . Smoking status: Never Smoker   . Smokeless tobacco: None  . Alcohol Use: No   OB History    No data available     Review of Systems  Constitutional: Negative.   HENT: Negative.   Respiratory: Negative.   Cardiovascular: Negative.   Gastrointestinal: Positive for nausea, vomiting, abdominal pain and constipation.  Musculoskeletal: Negative.   Skin: Negative.   Neurological: Negative.   Psychiatric/Behavioral: Negative.   All other systems reviewed and are negative.     Allergies  Penicillins  Home Medications   Prior to Admission medications   Medication Sig Start Date End Date Taking? Authorizing Provider  esomeprazole (NEXIUM) 20 MG capsule Take 20 mg by mouth daily at 12 noon.    Historical Provider, MD  famotidine (PEPCID) 20 MG tablet Take 1 tablet (20 mg total) by mouth 2 (two) times daily. 05/31/16   Fredia Sorrow, MD  ondansetron (ZOFRAN ODT) 4 MG disintegrating tablet Take 1 tablet (4 mg total) by mouth every 8 (eight) hours as needed for nausea or vomiting. 03/20/16   Blanchie Dessert, MD  polyethylene glycol (MIRALAX / GLYCOLAX) packet Take 17 g by mouth daily.    Historical Provider, MD  promethazine (PHENERGAN) 12.5 MG tablet Take 1 tablet (12.5 mg total) by mouth every 6 (six) hours as needed for nausea or vomiting. 05/31/16   Fredia Sorrow, MD  promethazine (PHENERGAN) 25 MG suppository Place 1 suppository (25 mg total) rectally every 6 (six) hours as needed for nausea or vomiting. 03/20/16   Blanchie Dessert, MD   BP 138/81 mmHg  Pulse 82  Temp(Src) 99.3 F (37.4 C) (Oral)  Resp 32  Ht 5\' 5"  (1.651 m)  Wt 140 lb (63.504 kg)  BMI 23.30 kg/m2  SpO2 100%  LMP 05/03/2016 Physical Exam  Constitutional: She is oriented to person, place, and time. She appears well-developed and well-nourished. She appears distressed.  Appears uncomfortable  HENT:  Head: Normocephalic and  atraumatic.  Eyes: Conjunctivae are normal. Pupils are equal, round, and reactive to light.  Neck: Neck supple. No tracheal deviation present. No thyromegaly present.  Cardiovascular: Normal rate and regular rhythm.   No murmur heard. Pulmonary/Chest: Effort normal and breath sounds normal.  Respiratory rate counted by me at 16 breaths per minute  Abdominal: Soft. Bowel sounds are normal. She exhibits no distension. There is tenderness.  Tender at epigastrium. No right upper quadrant tenderness. Negative Murphy sign  Musculoskeletal: Normal range of motion. She exhibits no edema or tenderness.  Neurological: She is alert and oriented to person, place, and time. No cranial nerve deficit. Coordination normal.  Skin: Skin is warm and dry. No rash noted.  Psychiatric: She has a normal mood and affect.  Nursing note and vitals reviewed.   ED Course  Procedures  DIAGNOSTIC STUDIES: Oxygen Saturation is 100% on RA, normal by my interpretation.  COORDINATION OF CARE: 6:51 PM-Discussed treatment plan with pt at bedside and pt agreed to plan.   Labs Review Labs Reviewed - No data to display  Imaging Review Dg Abd Acute W/chest  05/31/2016  CLINICAL DATA:  Diffuse abdominal pain, 3 days duration. Vomiting over the last day. EXAM: DG ABDOMEN ACUTE W/ 1V CHEST COMPARISON:  03/20/2016 FINDINGS: Bowel gas pattern is normal without evidence of ileus, obstruction or free air. No abnormal calcifications or acute bone findings. Phleboliths in the pelvis. Transitional S1 vertebra with sacral articulations. One-view chest shows normal heart and mediastinal shadows. The lungs are clear. No free air. IMPRESSION: Negative abdominal radiographs.  No acute cardiopulmonary disease. Electronically Signed   By: Nelson Chimes M.D.   On: 05/31/2016 19:37   I have personally reviewed and evaluated these images and lab results as part of my medical decision-making.   EKG Interpretation None     8:05 PM pain and  nausea under control after treatment with intravenous fluids, intravenous hydromorphone and intravenous Reglan 9:05 PM patient continues to feel well. No nausea. Pain under control. Able to drink without difficulty. MDM  I suspect the patient has peptic ulcer disease or GERD. Plan continue present medications. Prescription Norco. Prescription Reglan. She'll receive potassium chloride 40 meq orally prior to discharge. Encourage sleep 2 or 3 pillows. Diagnoses #1 epigastric pain #2 nausea and vomiting #3 hypokalemia Final diagnoses:  None      I personally performed the services described in this documentation, which was scribed in my presence. The recorded information has been reviewed and considered.    Orlie Dakin, MD 06/02/16 2114  Orlie Dakin, MD 06/02/16 2114

## 2016-06-02 NOTE — ED Notes (Signed)
Pt seen here yesterday. Given meds but unable to keep down. Continues with upper abd pain, N/V. Hx constipation. Took Fleets, but no results.

## 2016-06-02 NOTE — ED Notes (Signed)
Patient has had no vomiting since arrival to ED. Patient reports decrease in pain and nausea after medication. MD at bedside to reassess patient.

## 2016-12-08 ENCOUNTER — Emergency Department (HOSPITAL_BASED_OUTPATIENT_CLINIC_OR_DEPARTMENT_OTHER)
Admission: EM | Admit: 2016-12-08 | Discharge: 2016-12-08 | Disposition: A | Discharge status: Home / Self Care | Payer: Medicaid Other | Attending: Emergency Medicine | Admitting: Emergency Medicine

## 2016-12-08 ENCOUNTER — Encounter (HOSPITAL_BASED_OUTPATIENT_CLINIC_OR_DEPARTMENT_OTHER): Payer: Self-pay | Admitting: Emergency Medicine

## 2016-12-08 DIAGNOSIS — R1013 Epigastric pain: Secondary | ICD-10-CM

## 2016-12-08 DIAGNOSIS — R112 Nausea with vomiting, unspecified: Secondary | ICD-10-CM | POA: Insufficient documentation

## 2016-12-08 DIAGNOSIS — R6883 Chills (without fever): Secondary | ICD-10-CM | POA: Insufficient documentation

## 2016-12-08 DIAGNOSIS — K5900 Constipation, unspecified: Secondary | ICD-10-CM | POA: Insufficient documentation

## 2016-12-08 LAB — URINALYSIS, ROUTINE W REFLEX MICROSCOPIC
Bilirubin Urine: NEGATIVE
Glucose, UA: NEGATIVE mg/dL
Hgb urine dipstick: NEGATIVE
Ketones, ur: 40 mg/dL — AB
Leukocytes, UA: NEGATIVE
Nitrite: NEGATIVE
Protein, ur: NEGATIVE mg/dL
Specific Gravity, Urine: 1.018 (ref 1.005–1.030)
pH: 7.5 (ref 5.0–8.0)

## 2016-12-08 LAB — COMPREHENSIVE METABOLIC PANEL
ALT: 13 U/L — ABNORMAL LOW (ref 14–54)
AST: 19 U/L (ref 15–41)
Albumin: 4.7 g/dL (ref 3.5–5.0)
Alkaline Phosphatase: 50 U/L (ref 38–126)
Anion gap: 11 (ref 5–15)
BUN: 10 mg/dL (ref 6–20)
CO2: 21 mmol/L — ABNORMAL LOW (ref 22–32)
Calcium: 9.7 mg/dL (ref 8.9–10.3)
Chloride: 107 mmol/L (ref 101–111)
Creatinine, Ser: 0.63 mg/dL (ref 0.44–1.00)
GFR calc Af Amer: >60 mL/min (ref >60)
GFR calc non Af Amer: >60 mL/min (ref >60)
Glucose, Bld: 113 mg/dL — ABNORMAL HIGH (ref 65–99)
Potassium: 3.8 mmol/L (ref 3.5–5.1)
Sodium: 139 mmol/L (ref 135–145)
Total Bilirubin: 1.2 mg/dL (ref 0.3–1.2)
Total Protein: 7.7 g/dL (ref 6.5–8.1)

## 2016-12-08 LAB — RAPID URINE DRUG SCREEN, HOSP PERFORMED
Amphetamines: NOT DETECTED
BARBITURATES: NOT DETECTED
Benzodiazepines: NOT DETECTED
COCAINE: NOT DETECTED
Opiates: NOT DETECTED
TETRAHYDROCANNABINOL: POSITIVE — AB

## 2016-12-08 LAB — CBC
HCT: 38 % (ref 36.0–46.0)
Hemoglobin: 13.4 g/dL (ref 12.0–15.0)
MCH: 31.5 pg (ref 26.0–34.0)
MCHC: 35.3 g/dL (ref 30.0–36.0)
MCV: 89.2 fL (ref 78.0–100.0)
Platelets: 349 10*3/uL (ref 150–400)
RBC: 4.26 MIL/uL (ref 3.87–5.11)
RDW: 12.3 % (ref 11.5–15.5)
WBC: 10.8 10*3/uL — ABNORMAL HIGH (ref 4.0–10.5)

## 2016-12-08 LAB — LIPASE, BLOOD: Lipase: 28 U/L (ref 11–51)

## 2016-12-08 MED ORDER — HYDROMORPHONE HCL 1 MG/ML IJ SOLN
0.5000 mg | Freq: Once | INTRAMUSCULAR | Status: AC
  Administered 2016-12-08: 0.5 mg via INTRAVENOUS
  Filled 2016-12-08: qty 1

## 2016-12-08 MED ORDER — SODIUM CHLORIDE 0.9 % IV BOLUS (SEPSIS)
1000.0000 mL | Freq: Once | INTRAVENOUS | Status: AC
  Administered 2016-12-08: 1000 mL via INTRAVENOUS

## 2016-12-08 MED ORDER — PROMETHAZINE HCL 25 MG RE SUPP: 25.0000 mg | Freq: Four times a day (QID) | RECTAL | 0 refills | Status: DC | PRN

## 2016-12-08 MED ORDER — PROMETHAZINE HCL 25 MG/ML IJ SOLN
25.0000 mg | Freq: Once | INTRAMUSCULAR | Status: AC
  Administered 2016-12-08: 25 mg via INTRAVENOUS
  Filled 2016-12-08: qty 1

## 2016-12-08 MED ORDER — ONDANSETRON 4 MG PO TBDP: ORAL_TABLET | ORAL | 0 refills | Status: DC

## 2016-12-08 MED ORDER — ONDANSETRON HCL 4 MG/2ML IJ SOLN
4.0000 mg | Freq: Once | INTRAMUSCULAR | Status: AC | PRN
  Administered 2016-12-08: 4 mg via INTRAVENOUS
  Filled 2016-12-08: qty 2

## 2016-12-08 MED ORDER — ONDANSETRON 4 MG PO TBDP: 4.0000 mg | ORAL_TABLET | Freq: Three times a day (TID) | ORAL | 0 refills | Status: DC | PRN

## 2016-12-08 MED ORDER — FAMOTIDINE IN NACL 20-0.9 MG/50ML-% IV SOLN
20.0000 mg | Freq: Once | INTRAVENOUS | Status: AC
  Administered 2016-12-08: 20 mg via INTRAVENOUS
  Filled 2016-12-08: qty 50

## 2016-12-08 NOTE — ED Notes (Signed)
Pt states "ya'll can send me home now."  Pt reports she is "still hurting, but I'll be alright."

## 2016-12-08 NOTE — ED Notes (Signed)
Pt actively vomiting at triage.   

## 2016-12-08 NOTE — ED Notes (Signed)
ED Provider at bedside. 

## 2016-12-08 NOTE — ED Provider Notes (Signed)
[facility] Provider Note   CSN: [id number] Arrival date & time: 12/08/16  1434  By signing my name below, I, Norris Cross, attest that this documentation has been prepared under the direction and in the presence of Etta Quill, NP-C. Electronically Signed: Norris Cross , ED Scribe. 12/08/16. 4:11 PM.   History   Chief Complaint Chief Complaint  Patient presents with  . Abdominal Pain  . Emesis    HPI  Tammy Pittman is a 41 y.o. female with hx of stomach ulcers who presents to the Emergency Department complaining of moderate to severe abdominal pain with sudden onset x2 days. Pt states that for the past x2 days she has had constant epigastric abdominal pain with associated nausea, vomiting and chills. She states that the abdominal pain started with constipation followed by vomiting "acid" which she describes as "clear and foamy.: Pt was at the GI specialist a few months ago and advised to take Prilosec and be cautious of what she eats. She has taken Prilosec with no relief. Pt denies fever.    The history is provided by the patient. No language interpreter was used.    Past Medical History:  Diagnosis Date  . Constipation   . Ulcer of the stomach and intestine     Patient Active Problem List   Diagnosis Date Noted  . Abdominal pain   . Nausea with vomiting   . Vomiting 05/29/2015    Past Surgical History:  Procedure Laterality Date  . CESAREAN SECTION    . TUBAL LIGATION      OB History    No data available       Home Medications    Prior to Admission medications   Medication Sig Start Date End Date Taking? Authorizing Provider  esomeprazole (NEXIUM) 20 MG capsule Take 20 mg by mouth daily at 12 noon.    Historical Provider, MD  famotidine (PEPCID) 20 MG tablet Take 1 tablet (20 mg total) by mouth 2 (two) times daily. 05/31/16   Fredia Sorrow, MD  HYDROcodone-acetaminophen (NORCO) 5-325 MG tablet Take 1-2 tablets by mouth every 6 (six)  hours as needed for severe pain. 06/02/16   Orlie Dakin, MD  metoCLOPramide (REGLAN) 10 MG tablet Take 1 tablet (10 mg total) by mouth every 6 (six) hours as needed for nausea (nausea/headache). 06/02/16   Orlie Dakin, MD  ondansetron (ZOFRAN ODT) 4 MG disintegrating tablet Take 1 tablet (4 mg total) by mouth every 8 (eight) hours as needed for nausea or vomiting. 03/20/16   Blanchie Dessert, MD  polyethylene glycol (MIRALAX / GLYCOLAX) packet Take 17 g by mouth daily.    Historical Provider, MD  promethazine (PHENERGAN) 12.5 MG tablet Take 1 tablet (12.5 mg total) by mouth every 6 (six) hours as needed for nausea or vomiting. 05/31/16   Fredia Sorrow, MD  promethazine (PHENERGAN) 25 MG suppository Place 1 suppository (25 mg total) rectally every 6 (six) hours as needed for nausea or vomiting. 03/20/16   Blanchie Dessert, MD    Family History No family history on file.  Social History Social History  Substance Use Topics  . Smoking status: Never Smoker  . Smokeless tobacco: Never Used  . Alcohol use No     Allergies   Penicillins   Review of Systems Review of Systems  Constitutional: Positive for chills. Negative for fever.  Gastrointestinal: Positive for constipation, nausea and vomiting.     Physical Exam Updated Vital Signs BP 146/88   Pulse 87  Temp 97.9 F (36.6 C) (Oral)   Resp 20   Ht 5\' 5"  (1.651 m)   Wt 150 lb (68 kg)   SpO2 100%   BMI 24.96 kg/m   Physical Exam  Constitutional: She appears well-developed and well-nourished. No distress.  HENT:  Head: Normocephalic and atraumatic.  Eyes: Conjunctivae are normal.  Neck: Neck supple.  Cardiovascular: Normal rate and regular rhythm.   No murmur heard. Pulmonary/Chest: Effort normal and breath sounds normal. No respiratory distress.  Abdominal: Soft. There is tenderness in the epigastric area.  Epigastric region of the abdomen is tender, otherwise the abdominal exam is benign.   Musculoskeletal: She  exhibits no edema.  Neurological: She is alert.  Skin: Skin is warm and dry.  Psychiatric: She has a normal mood and affect.  Nursing note and vitals reviewed.    ED Treatments / Results   DIAGNOSTIC STUDIES: Oxygen Saturation is 100% on RA, normal by my interpretation.   COORDINATION OF CARE: 4:11 PM-Discussed next steps with pt. Pt verbalized understanding and is agreeable with the plan.    Labs (all labs ordered are listed, but only abnormal results are displayed) Labs Reviewed  LIPASE, BLOOD  COMPREHENSIVE METABOLIC PANEL  CBC  URINALYSIS, ROUTINE W REFLEX MICROSCOPIC    EKG  EKG Interpretation None       Radiology No results found.  Procedures Procedures (including critical care time)  Medications Ordered in ED Medications  ondansetron (ZOFRAN) injection 4 mg (4 mg Intravenous Given 12/08/16 1614)  sodium chloride 0.9 % bolus 1,000 mL (0 mLs Intravenous Stopped 12/08/16 1800)  famotidine (PEPCID) IVPB 20 mg premix (0 mg Intravenous Stopped 12/08/16 1703)  promethazine (PHENERGAN) injection 25 mg (25 mg Intravenous Given 12/08/16 1645)  sodium chloride 0.9 % bolus 1,000 mL (0 mLs Intravenous Stopped 12/08/16 1915)  HYDROmorphone (DILAUDID) injection 0.5 mg (0.5 mg Intravenous Given 12/08/16 1902)     Initial Impression / Assessment and Plan / ED Course  I have reviewed the triage vital signs and the nursing notes.  Pertinent labs & imaging results that were available during my care of the patient were reviewed by me and considered in my medical decision making (see chart for details).  Clinical Course   Patient with multiple visits for epigastric pain and frequent, unremitting emesis.  Labs reviewed and are reassuring. Is currently followed by gastroenterology. Patient is a frequent user of marijuana. She has tested positive for [facility] tonight and in some of her prior visits. 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 Patient's symptoms improved  after IV fluids, zofran, phenergan, pepcid, and dilaudid. Discussed with patient the correlation of marijuana use and CHS. Care instructions provided and return precautions discussed.       Final Clinical Impressions(s) / ED Diagnoses   Final diagnoses:  Epigastric pain    New Prescriptions Discharge Medication List as of 12/08/2016  7:46 PM     I personally performed the services described in this documentation, which was scribed in my presence. The recorded information has been reviewed and is accurate.     Etta Quill, NP 12/09/16 [id number]    Virgel Manifold, MD 12/17/16 [phone]

## 2016-12-08 NOTE — ED Triage Notes (Signed)
Pt with emesis for three days. Reports hx of stomach ulcers and unable to keep any fluids or medications down.

## 2016-12-08 NOTE — ED Notes (Signed)
RN called into room, pt is hyperventalating, breathes 50-60/minute.  Family in room states she was just very uncomfortable and pt now unable to talk due to hyperventalation.  Paper bag given and soothing measures implemented.  Family in room acting in supportive calming manner.  Pt sitting on edge of bed initially and was repositioned into bed for pt safety.  Pt breathing calmed to about 30 breathes/min.  Phenergan IV administered by Alyse Low, RN.

## 2017-04-24 ENCOUNTER — Emergency Department (HOSPITAL_BASED_OUTPATIENT_CLINIC_OR_DEPARTMENT_OTHER)
Admission: EM | Admit: 2017-04-24 | Discharge: 2017-04-24 | Disposition: A | Discharge status: Home / Self Care | Payer: Self-pay | Attending: Emergency Medicine | Admitting: Emergency Medicine

## 2017-04-24 ENCOUNTER — Encounter (HOSPITAL_BASED_OUTPATIENT_CLINIC_OR_DEPARTMENT_OTHER): Payer: Self-pay

## 2017-04-24 DIAGNOSIS — R1010 Upper abdominal pain, unspecified: Secondary | ICD-10-CM | POA: Insufficient documentation

## 2017-04-24 DIAGNOSIS — R112 Nausea with vomiting, unspecified: Secondary | ICD-10-CM | POA: Insufficient documentation

## 2017-04-24 DIAGNOSIS — R1013 Epigastric pain: Secondary | ICD-10-CM | POA: Insufficient documentation

## 2017-04-24 LAB — CBC
HEMATOCRIT: 39.4 % (ref 36.0–46.0)
Hemoglobin: 14.1 g/dL (ref 12.0–15.0)
MCH: 31.8 pg (ref 26.0–34.0)
MCHC: 35.8 g/dL (ref 30.0–36.0)
MCV: 88.9 fL (ref 78.0–100.0)
Platelets: 362 10*3/uL (ref 150–400)
RBC: 4.43 MIL/uL (ref 3.87–5.11)
RDW: 12.9 % (ref 11.5–15.5)
WBC: 10.2 10*3/uL (ref 4.0–10.5)

## 2017-04-24 LAB — COMPREHENSIVE METABOLIC PANEL
ALBUMIN: 4.6 g/dL (ref 3.5–5.0)
ALT: 17 U/L (ref 14–54)
AST: 21 U/L (ref 15–41)
Alkaline Phosphatase: 57 U/L (ref 38–126)
Anion gap: 8 (ref 5–15)
BILIRUBIN TOTAL: 0.6 mg/dL (ref 0.3–1.2)
BUN: 9 mg/dL (ref 6–20)
CO2: 24 mmol/L (ref 22–32)
Calcium: 9.7 mg/dL (ref 8.9–10.3)
Chloride: 105 mmol/L (ref 101–111)
Creatinine, Ser: 0.75 mg/dL (ref 0.44–1.00)
GFR calc Af Amer: >60 mL/min (ref >60)
GFR calc non Af Amer: >60 mL/min (ref >60)
GLUCOSE: 123 mg/dL — AB (ref 65–99)
Potassium: 4.3 mmol/L (ref 3.5–5.1)
Sodium: 137 mmol/L (ref 135–145)
TOTAL PROTEIN: 7.7 g/dL (ref 6.5–8.1)

## 2017-04-24 LAB — LIPASE, BLOOD: Lipase: 25 U/L (ref 11–51)

## 2017-04-24 MED ORDER — SUCRALFATE 1 G PO TABS: 1.0000 g | ORAL_TABLET | Freq: Three times a day (TID) | ORAL | 0 refills | Status: DC

## 2017-04-24 MED ORDER — PANTOPRAZOLE SODIUM 40 MG IV SOLR
40.0000 mg | Freq: Once | INTRAVENOUS | Status: AC
  Administered 2017-04-24: 40 mg via INTRAVENOUS
  Filled 2017-04-24: qty 40

## 2017-04-24 MED ORDER — METOCLOPRAMIDE HCL 10 MG PO TABS: 10.0000 mg | ORAL_TABLET | Freq: Three times a day (TID) | ORAL | 0 refills | Status: DC | PRN

## 2017-04-24 MED ORDER — MORPHINE SULFATE (PF) 4 MG/ML IV SOLN
4.0000 mg | Freq: Once | INTRAVENOUS | Status: AC
  Administered 2017-04-24: 4 mg via INTRAVENOUS
  Filled 2017-04-24: qty 1

## 2017-04-24 MED ORDER — METOCLOPRAMIDE HCL 5 MG/ML IJ SOLN
10.0000 mg | Freq: Once | INTRAMUSCULAR | Status: AC
  Administered 2017-04-24: 10 mg via INTRAVENOUS
  Filled 2017-04-24: qty 2

## 2017-04-24 MED ORDER — ONDANSETRON HCL 4 MG/2ML IJ SOLN
4.0000 mg | Freq: Once | INTRAMUSCULAR | Status: AC | PRN
  Administered 2017-04-24: 4 mg via INTRAVENOUS
  Filled 2017-04-24: qty 2

## 2017-04-24 MED ORDER — SODIUM CHLORIDE 0.9 % IV BOLUS (SEPSIS)
1000.0000 mL | Freq: Once | INTRAVENOUS | Status: AC
  Administered 2017-04-24: 1000 mL via INTRAVENOUS

## 2017-04-24 MED ORDER — GI COCKTAIL ~~LOC~~
30.0000 mL | Freq: Once | ORAL | Status: AC
  Administered 2017-04-24: 30 mL via ORAL
  Filled 2017-04-24: qty 30

## 2017-04-24 MED ORDER — LORAZEPAM 2 MG/ML IJ SOLN
1.0000 mg | Freq: Once | INTRAMUSCULAR | Status: AC
  Administered 2017-04-24: 1 mg via INTRAVENOUS
  Filled 2017-04-24: qty 1

## 2017-04-24 MED ORDER — SODIUM CHLORIDE 0.9 % IV BOLUS (SEPSIS)
1000.0000 mL | Freq: Once | INTRAVENOUS | Status: AC
Start: 2017-04-24 — End: 2017-04-24
  Administered 2017-04-24: 1000 mL via INTRAVENOUS

## 2017-04-24 MED FILL — SUCRALFATE 1 GM TABLET: 1 | 8 days supply | Qty: 30 | Fill #0

## 2017-04-24 MED FILL — METOCLOPRAMIDE 10 MG TABLET: 10 | 5 days supply | Qty: 15 | Fill #0

## 2017-04-24 NOTE — ED Notes (Signed)
Pt very anxious, on hands and knees on bed, writhing around,  hyperventilating; pt encouraged to slow, deep breathe;  Pt calmed down after a few minutes and VSS.

## 2017-04-24 NOTE — ED Notes (Signed)
RN called to room by visitor; visitor sts pt having trouble breathing

## 2017-04-24 NOTE — ED Provider Notes (Signed)
[facility] Provider Note   CSN: [id number] Arrival date & time: 04/24/17  1217     History   Chief Complaint Chief Complaint  Patient presents with  . Emesis    HPI Tammy Pittman is a 41 y.o. female.  HPI   Pt with hx PUD p/w N/V, upper abdominal pain that began last night after eating tacos.  No one else who ate the tacos is sick.  Had a small bowel movement this morning, is passing gas.  States this feels like prior flare up of PUD.  Takes zantac daily.  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   Denies marijuana use.    Past Medical History:  Diagnosis Date  . Constipation   . Ulcer of the stomach and intestine     Patient Active Problem List   Diagnosis Date Noted  . Abdominal pain   . Nausea with vomiting   . Vomiting 05/29/2015    Past Surgical History:  Procedure Laterality Date  . CESAREAN SECTION    . TUBAL LIGATION      OB History    No data available       Home Medications    Prior to Admission medications   Medication Sig Start Date End Date Taking? Authorizing Provider  metoCLOPramide (REGLAN) 10 MG tablet Take 1 tablet (10 mg total) by mouth every 8 (eight) hours as needed for nausea or vomiting. 04/24/17   Clayton Bibles, PA-C  sucralfate (CARAFATE) 1 g tablet Take 1 tablet (1 g total) by mouth 4 (four) times daily -  with meals and at bedtime. 04/24/17   Clayton Bibles, PA-C    Family History No family history on file.  Social History Social History  Substance Use Topics  . Smoking status: Never Smoker  . Smokeless tobacco: Never Used  . Alcohol use Yes     Comment: occ     Allergies   Penicillins   Review of Systems Review of Systems  All other systems reviewed and are negative.    Physical Exam Updated Vital Signs BP 121/61 (BP Location: Right Arm)   Pulse 76   Temp 97.6 F (36.4 C) (Oral)   Resp 18   Ht 5\' 5"  (1.651 m)   Wt 72.6 kg (160 lb)   LMP 03/27/2017   SpO2 100%   BMI 26.63 kg/m   Physical  Exam  Constitutional: She appears well-developed and well-nourished. No distress.  HENT:  Head: Normocephalic and atraumatic.  Neck: Neck supple.  Cardiovascular: Normal rate and regular rhythm.   Pulmonary/Chest: Effort normal and breath sounds normal. No respiratory distress. She has no wheezes. She has no rales.  Abdominal: Soft. She exhibits no distension and no mass. There is tenderness (epigastric ). There is no rebound and no guarding.  Neurological: She is alert.  Skin: She is not diaphoretic.  Nursing note and vitals reviewed.    ED Treatments / Results  Labs (all labs ordered are listed, but only abnormal results are displayed) Labs Reviewed  COMPREHENSIVE METABOLIC PANEL - Abnormal; Notable for the following:       Result Value   Glucose, Bld 123 (*)    All other components within normal limits  LIPASE, BLOOD  CBC  URINALYSIS, ROUTINE W REFLEX MICROSCOPIC    EKG  EKG Interpretation None       Radiology No results found.  Procedures Procedures (including critical care time)  Medications Ordered in ED Medications  ondansetron (ZOFRAN) injection 4 mg (4 mg Intravenous  Given 04/24/17 1318)  sodium chloride 0.9 % bolus 1,000 mL (0 mLs Intravenous Stopped 04/24/17 1405)  morphine 4 MG/ML injection 4 mg (4 mg Intravenous Given 04/24/17 1333)  metoCLOPramide (REGLAN) injection 10 mg (10 mg Intravenous Given 04/24/17 1331)  sodium chloride 0.9 % bolus 1,000 mL (0 mLs Intravenous Stopped 04/24/17 1447)  pantoprazole (PROTONIX) injection 40 mg (40 mg Intravenous Given 04/24/17 1449)  gi cocktail (Maalox,Lidocaine,Donnatal) (30 mLs Oral Given 04/24/17 1456)  LORazepam (ATIVAN) injection 1 mg (1 mg Intravenous Given 04/24/17 1455)     Initial Impression / Assessment and Plan / ED Course  I have reviewed the triage vital signs and the nursing notes.  Pertinent labs & imaging results that were available during my care of the patient were reviewed by me and considered in my  medical decision making (see chart for details).  Clinical Course as of Apr 25 1607  Wed Apr 24, 2017  1545 Pt is feeling much better.  Tolerating PO.    [EW]    Clinical Course User Index [EW] Chella Chapdelaine, [place]   Afebrile, nontoxic patient with upper abdominal pain, N/V after eating tacos, feels like PUD flare.  Suspect PUD vs reflux.  Pt feeling better after IVF, symptomatic treatment.  Workup reassuring.  Doubt acute pancreatitis, cholecystitis.  Abdominal exam is nonsurgical.   D/C home with reglan, carafate, PCP follow up.  Discussed result, findings, treatment, and follow up  with patient.  Pt given return precautions.  Pt verbalizes understanding and agrees with plan.       Final Clinical Impressions(s) / ED Diagnoses   Final diagnoses:  Pain of upper abdomen  Non-intractable vomiting with nausea, unspecified vomiting type    New Prescriptions New Prescriptions   METOCLOPRAMIDE (REGLAN) 10 MG TABLET    Take 1 tablet (10 mg total) by mouth every 8 (eight) hours as needed for nausea or vomiting.   SUCRALFATE (CARAFATE) 1 G TABLET    Take 1 tablet (1 g total) by mouth 4 (four) times daily -  with meals and at bedtime.     Clayton Bibles, PA-C 04/24/17 1608    Veryl Speak, MD 04/25/17 [phone]

## 2017-04-24 NOTE — Discharge Instructions (Signed)
Read the information below.  Use the prescribed medication as directed.  Please discuss all new medications with your pharmacist.  You may return to the Emergency Department at any time for worsening condition or any new symptoms that concern you.   If you develop high fevers, worsening abdominal pain, uncontrolled vomiting, or are unable to tolerate fluids by mouth, return to the ER for a recheck.  ° °

## 2017-04-24 NOTE — ED Triage Notes (Signed)
C/o abd pain, n/v x 7.5 hours-slow steady gait

## 2018-03-12 ENCOUNTER — Other Ambulatory Visit: Payer: Self-pay

## 2018-03-12 ENCOUNTER — Emergency Department (HOSPITAL_BASED_OUTPATIENT_CLINIC_OR_DEPARTMENT_OTHER)
Admission: EM | Admit: 2018-03-12 | Discharge: 2018-03-13 | Disposition: A | Discharge status: Home / Self Care | Payer: Self-pay | Attending: Emergency Medicine | Admitting: Emergency Medicine

## 2018-03-12 ENCOUNTER — Encounter (HOSPITAL_BASED_OUTPATIENT_CLINIC_OR_DEPARTMENT_OTHER): Payer: Self-pay | Admitting: *Deleted

## 2018-03-12 DIAGNOSIS — R112 Nausea with vomiting, unspecified: Secondary | ICD-10-CM | POA: Insufficient documentation

## 2018-03-12 DIAGNOSIS — Z79899 Other long term (current) drug therapy: Secondary | ICD-10-CM | POA: Insufficient documentation

## 2018-03-12 DIAGNOSIS — K529 Noninfective gastroenteritis and colitis, unspecified: Secondary | ICD-10-CM

## 2018-03-12 LAB — PREGNANCY, URINE: Preg Test, Ur: NEGATIVE

## 2018-03-12 MED ORDER — ONDANSETRON HCL 4 MG/2ML IJ SOLN
4.0000 mg | Freq: Once | INTRAMUSCULAR | Status: AC
Start: 2018-03-13 — End: 2018-03-13
  Administered 2018-03-13: 4 mg via INTRAVENOUS
  Filled 2018-03-12: qty 2

## 2018-03-12 NOTE — ED Triage Notes (Addendum)
Pt c/o n/v x 2 days denies abd pain

## 2018-03-13 LAB — URINALYSIS, MICROSCOPIC (REFLEX): WBC UA: NONE SEEN WBC/hpf (ref 0–5)

## 2018-03-13 LAB — COMPREHENSIVE METABOLIC PANEL
ALK PHOS: 68 U/L (ref 38–126)
ALT: 12 U/L — ABNORMAL LOW (ref 14–54)
ANION GAP: 17 — AB (ref 5–15)
AST: 27 U/L (ref 15–41)
Albumin: 5 g/dL (ref 3.5–5.0)
BILIRUBIN TOTAL: 1.3 mg/dL — AB (ref 0.3–1.2)
BUN: 16 mg/dL (ref 6–20)
CALCIUM: 10.1 mg/dL (ref 8.9–10.3)
CO2: 24 mmol/L (ref 22–32)
Chloride: 95 mmol/L — ABNORMAL LOW (ref 101–111)
Creatinine, Ser: 0.92 mg/dL (ref 0.44–1.00)
GFR calc non Af Amer: >60 mL/min (ref >60)
Glucose, Bld: 132 mg/dL — ABNORMAL HIGH (ref 65–99)
Potassium: 3.5 mmol/L (ref 3.5–5.1)
Sodium: 136 mmol/L (ref 135–145)
Total Protein: 9.5 g/dL — ABNORMAL HIGH (ref 6.5–8.1)

## 2018-03-13 LAB — CBC WITH DIFFERENTIAL/PLATELET
BASOS ABS: 0 10*3/uL (ref 0.0–0.1)
BASOS PCT: 0 %
Eosinophils Absolute: 0 10*3/uL (ref 0.0–0.7)
Eosinophils Relative: 0 %
HEMATOCRIT: 45.9 % (ref 36.0–46.0)
HEMOGLOBIN: 17 g/dL — AB (ref 12.0–15.0)
Lymphocytes Relative: 11 %
Lymphs Abs: 1.8 10*3/uL (ref 0.7–4.0)
MCH: 31.6 pg (ref 26.0–34.0)
MCHC: 37 g/dL — AB (ref 30.0–36.0)
MCV: 85.3 fL (ref 78.0–100.0)
MONO ABS: 1.1 10*3/uL — AB (ref 0.1–1.0)
MONOS PCT: 7 %
NEUTROS ABS: 13.2 10*3/uL — AB (ref 1.7–7.7)
NEUTROS PCT: 82 %
Platelets: 335 10*3/uL (ref 150–400)
RBC: 5.38 MIL/uL — ABNORMAL HIGH (ref 3.87–5.11)
RDW: 12.7 % (ref 11.5–15.5)
WBC: 16.1 10*3/uL — ABNORMAL HIGH (ref 4.0–10.5)

## 2018-03-13 LAB — URINALYSIS, ROUTINE W REFLEX MICROSCOPIC
Glucose, UA: NEGATIVE mg/dL
Ketones, ur: 15 mg/dL — AB
Leukocytes, UA: NEGATIVE
NITRITE: NEGATIVE
PH: 6 (ref 5.0–8.0)
Protein, ur: 30 mg/dL — AB
SPECIFIC GRAVITY, URINE: 1.02 (ref 1.005–1.030)

## 2018-03-13 LAB — LIPASE, BLOOD: Lipase: 27 U/L (ref 11–51)

## 2018-03-13 MED ORDER — PROMETHAZINE HCL 25 MG/ML IJ SOLN
12.5000 mg | Freq: Once | INTRAMUSCULAR | Status: AC
  Administered 2018-03-13: 12.5 mg via INTRAVENOUS
  Filled 2018-03-13: qty 1

## 2018-03-13 MED ORDER — KETOROLAC TROMETHAMINE 30 MG/ML IJ SOLN
30.0000 mg | Freq: Once | INTRAMUSCULAR | Status: AC
  Administered 2018-03-13: 30 mg via INTRAVENOUS
  Filled 2018-03-13: qty 1

## 2018-03-13 MED ORDER — ONDANSETRON HCL 8 MG PO TABS: 8.0000 mg | ORAL_TABLET | ORAL | 0 refills | Status: DC | PRN

## 2018-03-13 NOTE — ED Provider Notes (Signed)
[facility] EMERGENCY DEPARTMENT Provider Note   CSN: [id number] Arrival date & time: 03/12/18  2251     History   Chief Complaint Chief Complaint  Patient presents with  . Emesis    HPI Tammy Pittman is a 42 y.o. female.  This patient is a 42 year old female with history of prior C-section and tubal ligation.  She presents today for evaluation of nausea and vomiting.  This started 2 days ago and has been persistent.  She reports decreased intake over this period of time because every time she eats or drinks she vomits.  She denies any abdominal pain, constipation, or diarrhea.  She denies any ill contacts.  She does report having similar episodes in the past due to a "sensitive stomach".  The history is provided by the patient.  Emesis   This is a new problem. The current episode started 2 days ago. The problem occurs continuously. The problem has not changed since onset.The emesis has an appearance of stomach contents. There has been no fever. Pertinent negatives include no abdominal pain, no chills and no fever.    Past Medical History:  Diagnosis Date  . Constipation   . Ulcer of the stomach and intestine     Patient Active Problem List   Diagnosis Date Noted  . Abdominal pain   . Nausea with vomiting   . Vomiting 05/29/2015    Past Surgical History:  Procedure Laterality Date  . CESAREAN SECTION    . TUBAL LIGATION       OB History   None      Home Medications    Prior to Admission medications   Medication Sig Start Date End Date Taking? Authorizing Provider  metoCLOPramide (REGLAN) 10 MG tablet Take 1 tablet (10 mg total) by mouth every 8 (eight) hours as needed for nausea or vomiting. 04/24/17   Clayton Bibles, PA-C  sucralfate (CARAFATE) 1 g tablet Take 1 tablet (1 g total) by mouth 4 (four) times daily -  with meals and at bedtime. 04/24/17   Clayton Bibles, PA-C    Family History History reviewed. No pertinent family history.  Social  History Social History   Tobacco Use  . Smoking status: Never Smoker  . Smokeless tobacco: Never Used  Substance Use Topics  . Alcohol use: Yes    Comment: occ  . Drug use: No     Allergies   Penicillins   Review of Systems Review of Systems  Constitutional: Negative for chills and fever.  Gastrointestinal: Positive for vomiting. Negative for abdominal pain.  All other systems reviewed and are negative.    Physical Exam Updated Vital Signs BP 118/62   Pulse (!) 112   Temp 98.1 F (36.7 C)   Resp 18   Ht 5\' 5"  (1.651 m)   Wt 81.6 kg (180 lb)   LMP 02/11/2018   SpO2 100%   BMI 29.95 kg/m   Physical Exam  Constitutional: She is oriented to person, place, and time. She appears well-developed and well-nourished. No distress.  HENT:  Head: Normocephalic and atraumatic.  Mouth/Throat: Oropharynx is clear and moist.  Neck: Normal range of motion. Neck supple.  Cardiovascular: Normal rate and regular rhythm. Exam reveals no gallop and no friction rub.  No murmur heard. Pulmonary/Chest: Effort normal and breath sounds normal. No respiratory distress. She has no wheezes.  Abdominal: Soft. Bowel sounds are normal. She exhibits no distension. There is no tenderness.  Musculoskeletal: Normal range of motion.  Neurological: She  is alert and oriented to person, place, and time.  Skin: Skin is warm and dry. She is not diaphoretic.  Nursing note and vitals reviewed.    ED Treatments / Results  Labs (all labs ordered are listed, but only abnormal results are displayed) Labs Reviewed  URINALYSIS, ROUTINE W REFLEX MICROSCOPIC - Abnormal; Notable for the following components:      Result Value   APPearance CLOUDY (*)    Hgb urine dipstick TRACE (*)    Bilirubin Urine SMALL (*)    Ketones, ur 15 (*)    Protein, ur 30 (*)    All other components within normal limits  URINALYSIS, MICROSCOPIC (REFLEX) - Abnormal; Notable for the following components:   Bacteria, UA RARE (*)     Squamous Epithelial / LPF 6-30 (*)    All other components within normal limits  PREGNANCY, URINE  COMPREHENSIVE METABOLIC PANEL  CBC WITH DIFFERENTIAL/PLATELET  LIPASE, BLOOD    EKG None  Radiology No results found.  Procedures Procedures (including critical care time)  Medications Ordered in ED Medications  ketorolac (TORADOL) 30 MG/ML injection 30 mg (has no administration in time range)  promethazine (PHENERGAN) injection 12.5 mg (has no administration in time range)  ondansetron (ZOFRAN) injection 4 mg (4 mg Intravenous Given 03/13/18 0007)     Initial Impression / Assessment and Plan / ED Course  I have reviewed the triage vital signs and the nursing notes.  Pertinent labs & imaging results that were available during my care of the patient were reviewed by me and considered in my medical decision making (see chart for details).  Patient presenting with nausea and vomiting for the past 2 days.  Her abdominal exam is benign and laboratory studies are reassuring.  I see no indication for imaging studies.  She is feeling better after fluids and medications in the ED.  She has had similar episodes in the past and I see no indication for further workup at this time.  Final Clinical Impressions(s) / ED Diagnoses   Final diagnoses:  None    ED Discharge Orders    None       Veryl Speak, MD 03/13/18 [phone]

## 2018-03-13 NOTE — Discharge Instructions (Addendum)
Zofran as prescribed as needed for nausea.  There are liquid diet for the next 12 hours, then slowly advance to normal.  Return to the emergency department for severe abdominal pain, high fever, or other new and concerning symptoms.

## 2018-03-13 NOTE — ED Notes (Signed)
Lab collected from IV and sent to lab if need it.

## 2018-03-14 ENCOUNTER — Other Ambulatory Visit: Payer: Self-pay

## 2018-03-14 ENCOUNTER — Encounter (HOSPITAL_BASED_OUTPATIENT_CLINIC_OR_DEPARTMENT_OTHER): Payer: Self-pay | Admitting: *Deleted

## 2018-03-14 ENCOUNTER — Emergency Department (HOSPITAL_BASED_OUTPATIENT_CLINIC_OR_DEPARTMENT_OTHER)
Admission: EM | Admit: 2018-03-14 | Discharge: 2018-03-14 | Disposition: A | Discharge status: Home / Self Care | Payer: Self-pay | Attending: Emergency Medicine | Admitting: Emergency Medicine

## 2018-03-14 DIAGNOSIS — E860 Dehydration: Secondary | ICD-10-CM | POA: Insufficient documentation

## 2018-03-14 DIAGNOSIS — R112 Nausea with vomiting, unspecified: Secondary | ICD-10-CM

## 2018-03-14 DIAGNOSIS — R82998 Other abnormal findings in urine: Secondary | ICD-10-CM | POA: Insufficient documentation

## 2018-03-14 LAB — URINALYSIS, ROUTINE W REFLEX MICROSCOPIC
Bilirubin Urine: NEGATIVE
Glucose, UA: NEGATIVE mg/dL
Ketones, ur: >80 mg/dL — AB
Leukocytes, UA: NEGATIVE
Nitrite: NEGATIVE
Protein, ur: NEGATIVE mg/dL
Specific Gravity, Urine: 1.025 (ref 1.005–1.030)
pH: 6 (ref 5.0–8.0)

## 2018-03-14 LAB — CBC WITH DIFFERENTIAL/PLATELET
Basophils Absolute: 0 10*3/uL (ref 0.0–0.1)
Basophils Relative: 0 %
Eosinophils Absolute: 0 10*3/uL (ref 0.0–0.7)
Eosinophils Relative: 0 %
HCT: 43.9 % (ref 36.0–46.0)
Hemoglobin: 16.1 g/dL — ABNORMAL HIGH (ref 12.0–15.0)
Lymphocytes Relative: 14 %
Lymphs Abs: 1.9 10*3/uL (ref 0.7–4.0)
MCH: 31.6 pg (ref 26.0–34.0)
MCHC: 36.7 g/dL — ABNORMAL HIGH (ref 30.0–36.0)
MCV: 86.2 fL (ref 78.0–100.0)
Monocytes Absolute: 1.1 10*3/uL — ABNORMAL HIGH (ref 0.1–1.0)
Monocytes Relative: 8 %
Neutro Abs: 10.4 10*3/uL — ABNORMAL HIGH (ref 1.7–7.7)
Neutrophils Relative %: 78 %
Platelets: 346 10*3/uL (ref 150–400)
RBC: 5.09 MIL/uL (ref 3.87–5.11)
RDW: 12.7 % (ref 11.5–15.5)
WBC: 13.4 10*3/uL — ABNORMAL HIGH (ref 4.0–10.5)

## 2018-03-14 LAB — URINALYSIS, MICROSCOPIC (REFLEX)

## 2018-03-14 LAB — COMPREHENSIVE METABOLIC PANEL
ALT: 14 U/L (ref 14–54)
AST: 19 U/L (ref 15–41)
Albumin: 4.5 g/dL (ref 3.5–5.0)
Alkaline Phosphatase: 65 U/L (ref 38–126)
Anion gap: 14 (ref 5–15)
BUN: 17 mg/dL (ref 6–20)
CO2: 23 mmol/L (ref 22–32)
Calcium: 9.3 mg/dL (ref 8.9–10.3)
Chloride: 97 mmol/L — ABNORMAL LOW (ref 101–111)
Creatinine, Ser: 0.9 mg/dL (ref 0.44–1.00)
GFR calc Af Amer: >60 mL/min (ref >60)
GFR calc non Af Amer: >60 mL/min (ref >60)
Glucose, Bld: 93 mg/dL (ref 65–99)
Potassium: 3.2 mmol/L — ABNORMAL LOW (ref 3.5–5.1)
Sodium: 134 mmol/L — ABNORMAL LOW (ref 135–145)
Total Bilirubin: 1.3 mg/dL — ABNORMAL HIGH (ref 0.3–1.2)
Total Protein: 8.2 g/dL — ABNORMAL HIGH (ref 6.5–8.1)

## 2018-03-14 LAB — LIPASE, BLOOD: Lipase: 26 U/L (ref 11–51)

## 2018-03-14 LAB — PREGNANCY, URINE: Preg Test, Ur: NEGATIVE

## 2018-03-14 MED ORDER — SODIUM CHLORIDE 0.9 % IV BOLUS
1000.0000 mL | Freq: Once | INTRAVENOUS | Status: AC
Start: 2018-03-14 — End: 2018-03-14
  Administered 2018-03-14: 1000 mL via INTRAVENOUS

## 2018-03-14 MED ORDER — FAMOTIDINE IN NACL 20-0.9 MG/50ML-% IV SOLN
20.0000 mg | Freq: Two times a day (BID) | INTRAVENOUS | Status: DC
  Administered 2018-03-14: 20 mg via INTRAVENOUS
  Filled 2018-03-14: qty 50

## 2018-03-14 MED ORDER — FAMOTIDINE 40 MG PO TABS: 40.0000 mg | ORAL_TABLET | Freq: Every day | ORAL | 0 refills | Status: DC

## 2018-03-14 MED ORDER — SUCRALFATE 1 G PO TABS: 1.0000 g | ORAL_TABLET | Freq: Three times a day (TID) | ORAL | 0 refills | Status: DC

## 2018-03-14 MED ORDER — PROMETHAZINE HCL 25 MG/ML IJ SOLN
12.5000 mg | Freq: Once | INTRAMUSCULAR | Status: AC
  Administered 2018-03-14: 12.5 mg via INTRAVENOUS
  Filled 2018-03-14: qty 1

## 2018-03-14 MED ORDER — PROMETHAZINE HCL 25 MG RE SUPP: 25.0000 mg | Freq: Four times a day (QID) | RECTAL | 0 refills | Status: DC | PRN

## 2018-03-14 MED ORDER — SODIUM CHLORIDE 0.9 % IV BOLUS
1000.0000 mL | Freq: Once | INTRAVENOUS | Status: AC
  Administered 2018-03-14: 1000 mL via INTRAVENOUS

## 2018-03-14 MED ORDER — POTASSIUM CHLORIDE CRYS ER 20 MEQ PO TBCR
60.0000 meq | EXTENDED_RELEASE_TABLET | Freq: Once | ORAL | Status: AC
  Administered 2018-03-14: 60 meq via ORAL
  Filled 2018-03-14: qty 3

## 2018-03-14 MED ORDER — METOCLOPRAMIDE HCL 5 MG/ML IJ SOLN
10.0000 mg | Freq: Once | INTRAMUSCULAR | Status: AC
  Administered 2018-03-14: 10 mg via INTRAVENOUS
  Filled 2018-03-14: qty 2

## 2018-03-14 MED ORDER — LORAZEPAM 2 MG/ML IJ SOLN
0.5000 mg | Freq: Once | INTRAMUSCULAR | Status: AC
  Administered 2018-03-14: 0.5 mg via INTRAVENOUS
  Filled 2018-03-14: qty 1

## 2018-03-14 MED FILL — FAMOTIDINE 40 MG TABLET: 40 | 30 days supply | Qty: 30 | Fill #0

## 2018-03-14 MED FILL — PHENADOZ 25 MG SUPP: 25 | 4 days supply | Qty: 15 | Fill #0

## 2018-03-14 MED FILL — SUCRALFATE 1 GM TABLET: 1 | 8 days supply | Qty: 30 | Fill #0

## 2018-03-14 NOTE — ED Notes (Signed)
Family at bedside. 

## 2018-03-14 NOTE — ED Provider Notes (Signed)
[facility] EMERGENCY DEPARTMENT Provider Note   CSN: [id number] Arrival date & time: 03/14/18  1116     History   Chief Complaint Chief Complaint  Patient presents with  . Emesis    HPI Tammy Pittman is a 42 y.o. female with history of gastric ulcers who presents with a 3-day history of vomiting.  She reports her symptoms feel similar of flareup of her ulcers.  Patient was here 2 days ago and had evaluation and was feeling better, however patient has since gone home and take an ODT Zofran and cannot keep fluids down.  She feels very dehydrated.  She is only urinated once at home since she was here 2 days ago.  She has not been able to keep anything down.  She denies any abdominal pain, fevers, chest pain, shortness of breath, urinary symptoms, abnormal vaginal bleeding or discharge.  Patient does not currently have a gastroenterologist.  She reports she had had her ulcers under control.  She has had increased stress lately.  HPI  Past Medical History:  Diagnosis Date  . Constipation   . Ulcer of the stomach and intestine     Patient Active Problem List   Diagnosis Date Noted  . Abdominal pain   . Nausea with vomiting   . Vomiting 05/29/2015    Past Surgical History:  Procedure Laterality Date  . CESAREAN SECTION    . TUBAL LIGATION       OB History   None      Home Medications    Prior to Admission medications   Medication Sig Start Date End Date Taking? Authorizing Provider  famotidine (PEPCID) 40 MG tablet Take 1 tablet (40 mg total) by mouth at bedtime. 03/14/18   Syna Gad, Bea Graff, PA-C  metoCLOPramide (REGLAN) 10 MG tablet Take 1 tablet (10 mg total) by mouth every 8 (eight) hours as needed for nausea or vomiting. 04/24/17   Clayton Bibles, PA-C  ondansetron (ZOFRAN) 8 MG tablet Take 1 tablet (8 mg total) by mouth every 4 (four) hours as needed for nausea. 03/13/18   Veryl Speak, MD  promethazine (PHENERGAN) 25 MG suppository Place 1 suppository (25  mg total) rectally every 6 (six) hours as needed for nausea or vomiting. 03/14/18   Tomoko Sandra, Bea Graff, PA-C  sucralfate (CARAFATE) 1 g tablet Take 1 tablet (1 g total) by mouth 4 (four) times daily -  with meals and at bedtime. 03/14/18   Frederica Kuster, PA-C    Family History No family history on file.  Social History Social History   Tobacco Use  . Smoking status: Never Smoker  . Smokeless tobacco: Never Used  Substance Use Topics  . Alcohol use: Yes    Comment: occ  . Drug use: No     Allergies   Penicillins   Review of Systems Review of Systems  Constitutional: Negative for chills and fever.  HENT: Negative for facial swelling and sore throat.   Respiratory: Negative for shortness of breath.   Cardiovascular: Negative for chest pain.  Gastrointestinal: Positive for nausea and vomiting. Negative for abdominal pain and blood in stool.  Genitourinary: Negative for dysuria.  Musculoskeletal: Negative for back pain.  Skin: Negative for rash and wound.  Neurological: Negative for headaches.  Psychiatric/Behavioral: The patient is not nervous/anxious.      Physical Exam Updated Vital Signs BP (!) 151/86   Pulse 73   Temp 98.7 F (37.1 C) (Oral)   Resp 16   Ht 5'  5" (1.651 m)   Wt 81.6 kg (180 lb)   LMP 03/13/2018   SpO2 100%   BMI 29.95 kg/m   Physical Exam  Constitutional: She appears well-developed and well-nourished. No distress.  HENT:  Head: Normocephalic and atraumatic.  Mouth/Throat: No oropharyngeal exudate.  Mildly dry mucous membranes  Eyes: Pupils are equal, round, and reactive to light. Conjunctivae are normal. Right eye exhibits no discharge. Left eye exhibits no discharge. No scleral icterus.  Neck: Normal range of motion. Neck supple. No thyromegaly present.  Cardiovascular: Normal rate, regular rhythm, normal heart sounds and intact distal pulses. Exam reveals no gallop and no friction rub.  No murmur heard. Pulmonary/Chest: Effort normal and  breath sounds normal. No stridor. No respiratory distress. She has no wheezes. She has no rales.  Abdominal: Soft. Bowel sounds are normal. She exhibits no distension. There is no tenderness. There is no rebound and no guarding.  Musculoskeletal: She exhibits no edema.  Lymphadenopathy:    She has no cervical adenopathy.  Neurological: She is alert. Coordination normal.  Skin: Skin is warm and dry. No rash noted. She is not diaphoretic. No pallor.  Psychiatric: She has a normal mood and affect.  Nursing note and vitals reviewed.    ED Treatments / Results  Labs (all labs ordered are listed, but only abnormal results are displayed) Labs Reviewed  COMPREHENSIVE METABOLIC PANEL - Abnormal; Notable for the following components:      Result Value   Sodium 134 (*)    Potassium 3.2 (*)    Chloride 97 (*)    Total Protein 8.2 (*)    Total Bilirubin 1.3 (*)    All other components within normal limits  CBC WITH DIFFERENTIAL/PLATELET - Abnormal; Notable for the following components:   WBC 13.4 (*)    Hemoglobin 16.1 (*)    MCHC 36.7 (*)    Neutro Abs 10.4 (*)    Monocytes Absolute 1.1 (*)    All other components within normal limits  URINALYSIS, ROUTINE W REFLEX MICROSCOPIC - Abnormal; Notable for the following components:   APPearance HAZY (*)    Hgb urine dipstick SMALL (*)    Ketones, ur >80 (*)    All other components within normal limits  URINALYSIS, MICROSCOPIC (REFLEX) - Abnormal; Notable for the following components:   Bacteria, UA MANY (*)    Squamous Epithelial / LPF 0-5 (*)    All other components within normal limits  LIPASE, BLOOD  PREGNANCY, URINE    EKG None  Radiology No results found.  Procedures Procedures (including critical care time)  Medications Ordered in ED Medications  famotidine (PEPCID) IVPB 20 mg premix (0 mg Intravenous Stopped 03/14/18 1252)  sodium chloride 0.9 % bolus 1,000 mL (0 mLs Intravenous Stopped 03/14/18 1427)  metoCLOPramide  (REGLAN) injection 10 mg (10 mg Intravenous Given 03/14/18 1204)  sodium chloride 0.9 % bolus 1,000 mL (0 mLs Intravenous Stopped 03/14/18 1615)  potassium chloride SA (K-DUR,KLOR-CON) CR tablet 60 mEq (60 mEq Oral Given 03/14/18 1318)  promethazine (PHENERGAN) injection 12.5 mg (12.5 mg Intravenous Given 03/14/18 1328)  promethazine (PHENERGAN) injection 12.5 mg (12.5 mg Intravenous Given 03/14/18 1439)  LORazepam (ATIVAN) injection 0.5 mg (0.5 mg Intravenous Given 03/14/18 1625)  sodium chloride 0.9 % bolus 1,000 mL (0 mLs Intravenous Stopped 03/14/18 1748)     Initial Impression / Assessment and Plan / ED Course  I have reviewed the triage vital signs and the nursing notes.  Pertinent labs & imaging results that  were available during my care of the patient were reviewed by me and considered in my medical decision making (see chart for details).     Suspect symptoms related to gastric ulcers, as patient does have history.  She has associated dehydration.  3 L of fluid given in the ED and nausea vomiting controlled with Reglan, Phenergan, and Ativan.  No abdominal pain or tenderness.  CBC shows WBC 13.4, hemoglobin 16.1.  Suspect leukocytosis is reactive related to vomiting.  CMP shows sodium 134, potassium 3.2, chloride 97, total bilirubin 1.3.  Potassium attempted to be replaced in the ED, however patient had difficulty tolerating the oral replacement.  Will discharge home with the medication.  UA shows small hematuria, greater than 80 ketones, 6-30 WBCs, many bacteria.  Urine culture sent.  Patient without urinary symptoms, however considering change in microscopic UA from 2 days ago, would treat if positive.  Will discharge home with Carafate, Pepcid, Phenergan and follow-up to GI for further management.  Diet progression discussed including clear fluids only until tolerated.  Return precautions discussed.  Patient understands and agrees with plan.  Patient vitals stable throughout ED course and  discharged in satisfactory condition. I discussed patient case with Dr. Sherry Ruffing who guided the patient's management and agrees with plan.   Final Clinical Impressions(s) / ED Diagnoses   Final diagnoses:  Dehydration  Non-intractable vomiting with nausea, unspecified vomiting type    ED Discharge Orders        Ordered    sucralfate (CARAFATE) 1 g tablet  3 times daily with meals & bedtime     03/14/18 1742    promethazine (PHENERGAN) 25 MG suppository  Every 6 hours PRN     03/14/18 1742    famotidine (PEPCID) 40 MG tablet  Daily at bedtime     03/14/18 1742       LawBea Graff, PA-C 03/14/18 2000    Tegeler, Gwenyth Allegra, MD 03/15/18 [phone]

## 2018-03-14 NOTE — ED Notes (Signed)
Unable to urinate at this time.  

## 2018-03-14 NOTE — Discharge Instructions (Signed)
Begin taking Pepcid daily.  Take Phenergan every 6 hours as needed for nausea or vomiting.  Take Carafate before meals and with bedtime.  Begin with only clear liquids.  Do not try to eat solid foods until you are tolerating clear liquids well.  Begin with bananas, rice, applesauce, toast.  Avoid greasy, fatty, fried foods as well as green vegetables.  Please return the emergency department if you develop any new or worsening symptoms including intractable vomiting, localized abdominal pain, fevers, black tarry stools, or any other new concerning symptom.

## 2018-03-14 NOTE — ED Notes (Signed)
Pt. Reports she does not feel like urinating at this time.  Pt. Also stated she was just here 2 days ago with this same thing.

## 2018-03-14 NOTE — ED Notes (Signed)
Pt. Has been drinking oral fluids and kept down all of her oral hydration.

## 2018-03-14 NOTE — ED Triage Notes (Signed)
Vomiting x 3 days

## 2018-03-16 LAB — URINE CULTURE

## 2018-06-21 ENCOUNTER — Other Ambulatory Visit: Payer: Self-pay

## 2018-06-21 ENCOUNTER — Encounter (HOSPITAL_BASED_OUTPATIENT_CLINIC_OR_DEPARTMENT_OTHER): Payer: Self-pay | Admitting: Emergency Medicine

## 2018-06-21 ENCOUNTER — Emergency Department (HOSPITAL_BASED_OUTPATIENT_CLINIC_OR_DEPARTMENT_OTHER)
Admission: EM | Admit: 2018-06-21 | Discharge: 2018-06-21 | Discharge status: Against Medical Advice | Payer: Self-pay | Attending: Emergency Medicine | Admitting: Emergency Medicine

## 2018-06-21 DIAGNOSIS — R1115 Cyclical vomiting syndrome unrelated to migraine: Secondary | ICD-10-CM

## 2018-06-21 DIAGNOSIS — G43A0 Cyclical vomiting, not intractable: Secondary | ICD-10-CM | POA: Insufficient documentation

## 2018-06-21 DIAGNOSIS — Z79899 Other long term (current) drug therapy: Secondary | ICD-10-CM | POA: Insufficient documentation

## 2018-06-21 LAB — COMPREHENSIVE METABOLIC PANEL
ALT: 14 U/L (ref 0–44)
ANION GAP: 10 (ref 5–15)
AST: 19 U/L (ref 15–41)
Albumin: 4.3 g/dL (ref 3.5–5.0)
Alkaline Phosphatase: 60 U/L (ref 38–126)
BUN: 9 mg/dL (ref 6–20)
CHLORIDE: 104 mmol/L (ref 98–111)
CO2: 25 mmol/L (ref 22–32)
Calcium: 9.6 mg/dL (ref 8.9–10.3)
Creatinine, Ser: 0.77 mg/dL (ref 0.44–1.00)
Glucose, Bld: 135 mg/dL — ABNORMAL HIGH (ref 70–99)
Potassium: 3.7 mmol/L (ref 3.5–5.1)
SODIUM: 139 mmol/L (ref 135–145)
Total Bilirubin: 0.5 mg/dL (ref 0.3–1.2)
Total Protein: 7.8 g/dL (ref 6.5–8.1)

## 2018-06-21 LAB — CBC
HEMATOCRIT: 40.2 % (ref 36.0–46.0)
HEMOGLOBIN: 14.3 g/dL (ref 12.0–15.0)
MCH: 31.8 pg (ref 26.0–34.0)
MCHC: 35.6 g/dL (ref 30.0–36.0)
MCV: 89.5 fL (ref 78.0–100.0)
Platelets: 384 10*3/uL (ref 150–400)
RBC: 4.49 MIL/uL (ref 3.87–5.11)
RDW: 12.9 % (ref 11.5–15.5)
WBC: 10.8 10*3/uL — ABNORMAL HIGH (ref 4.0–10.5)

## 2018-06-21 LAB — LIPASE, BLOOD: LIPASE: 26 U/L (ref 11–51)

## 2018-06-21 MED ORDER — ONDANSETRON HCL 4 MG/2ML IJ SOLN
4.0000 mg | Freq: Once | INTRAMUSCULAR | Status: AC
  Administered 2018-06-21: 4 mg via INTRAVENOUS
  Filled 2018-06-21: qty 2

## 2018-06-21 MED ORDER — LORAZEPAM 2 MG/ML IJ SOLN
0.5000 mg | Freq: Once | INTRAMUSCULAR | Status: DC
Start: 2018-06-21 — End: 2018-06-21

## 2018-06-21 MED ORDER — METOCLOPRAMIDE HCL 5 MG/ML IJ SOLN
10.0000 mg | Freq: Once | INTRAMUSCULAR | Status: AC
Start: 2018-06-21 — End: 2018-06-21
  Administered 2018-06-21: 10 mg via INTRAVENOUS
  Filled 2018-06-21: qty 2

## 2018-06-21 MED ORDER — SODIUM CHLORIDE 0.9 % IV BOLUS
1000.0000 mL | Freq: Once | INTRAVENOUS | Status: AC
  Administered 2018-06-21: 1000 mL via INTRAVENOUS

## 2018-06-21 NOTE — ED Notes (Signed)
Pt sts she is going to leave now so that she can go "get in my bed."

## 2018-06-21 NOTE — ED Provider Notes (Signed)
[facility] EMERGENCY DEPARTMENT Provider Note   CSN: [id number] Arrival date & time: 06/21/18  1411     History   Chief Complaint Chief Complaint  Patient presents with  . Emesis    HPI Tammy Pittman is a 42 y.o. female.  42 year old female with a past medical history of gastric ulcers presents to the ED with vomiting, nausea since 12 hours ago.  Patient states she had some spicy food yesterday and this may have caused a flareup in her ulcers.  She tried doing a fleets enema, but this did not help relieve her symptoms and instead cause her to vomit.  Patient also reports she tried taking her nausea medication but this has not helped.  She has been seen at this hospital for the same chief complaint she states "when I have my ulcer flareups I come here to get hydrated and feel better".  He denies any blood in her vomit.  Patient denies any fever, diarrhea, abdominal pain, chest pain or shortness of breath.     Past Medical History:  Diagnosis Date  . Constipation   . Ulcer of the stomach and intestine     Patient Active Problem List   Diagnosis Date Noted  . Abdominal pain   . Nausea with vomiting   . Vomiting 05/29/2015    Past Surgical History:  Procedure Laterality Date  . CESAREAN SECTION    . TUBAL LIGATION       OB History   None      Home Medications    Prior to Admission medications   Medication Sig Start Date End Date Taking? Authorizing Provider  famotidine (PEPCID) 40 MG tablet Take 1 tablet (40 mg total) by mouth at bedtime. 03/14/18   Law, Bea Graff, PA-C  metoCLOPramide (REGLAN) 10 MG tablet Take 1 tablet (10 mg total) by mouth every 8 (eight) hours as needed for nausea or vomiting. 04/24/17   Clayton Bibles, PA-C  ondansetron (ZOFRAN) 8 MG tablet Take 1 tablet (8 mg total) by mouth every 4 (four) hours as needed for nausea. 03/13/18   Veryl Speak, MD  promethazine (PHENERGAN) 25 MG suppository Place 1 suppository (25 mg total) rectally  every 6 (six) hours as needed for nausea or vomiting. 03/14/18   Law, Bea Graff, PA-C  sucralfate (CARAFATE) 1 g tablet Take 1 tablet (1 g total) by mouth 4 (four) times daily -  with meals and at bedtime. 03/14/18   Frederica Kuster, PA-C    Family History No family history on file.  Social History Social History   Tobacco Use  . Smoking status: Never Smoker  . Smokeless tobacco: Never Used  Substance Use Topics  . Alcohol use: Yes    Comment: occ  . Drug use: No     Allergies   Penicillins   Review of Systems Review of Systems  Constitutional: Negative for chills and fever.  HENT: Negative for ear pain and sore throat.   Eyes: Negative for pain and visual disturbance.  Respiratory: Negative for cough and shortness of breath.   Cardiovascular: Negative for chest pain and palpitations.  Gastrointestinal: Positive for nausea and vomiting. Negative for abdominal pain and diarrhea.  Genitourinary: Negative for dysuria and hematuria.  Musculoskeletal: Negative for arthralgias and back pain.  Skin: Negative for color change and rash.  Allergic/Immunologic: Negative for immunocompromised state.  Neurological: Negative for seizures, syncope and headaches.  All other systems reviewed and are negative.    Physical Exam Updated Vital  Signs BP 131/75 (BP Location: Left Arm)   Pulse 66   Temp 97.9 F (36.6 C) (Oral)   Resp 18   Ht 5\' 5"  (1.651 m)   Wt 81.6 kg (180 lb)   LMP 06/10/2018   SpO2 100%   BMI 29.95 kg/m   Physical Exam  Constitutional: She is oriented to person, place, and time. She appears well-developed and well-nourished. No distress.  HENT:  Head: Normocephalic and atraumatic.  Mouth/Throat: Oropharynx is clear and moist. No oropharyngeal exudate.  Eyes: Pupils are equal, round, and reactive to light.  Neck: Normal range of motion.  Cardiovascular: Regular rhythm and normal heart sounds.  Pulmonary/Chest: Effort normal and breath sounds normal. No  respiratory distress.  Abdominal: Soft. Bowel sounds are normal. She exhibits no distension, no pulsatile liver and no fluid wave. There is no tenderness.  Patient denies abdominal pain but states she is got abdominal soreness from vomiting.No tenderness on exam.   Musculoskeletal: She exhibits no tenderness or deformity.       Right lower leg: She exhibits no edema.       Left lower leg: She exhibits no edema.  Neurological: She is alert and oriented to person, place, and time.  Skin: Skin is warm and dry. Capillary refill takes less than 2 seconds. No erythema.  Mild dehydration, skin turgor  Psychiatric: She has a normal mood and affect.  Nursing note and vitals reviewed.    ED Treatments / Results  Labs (all labs ordered are listed, but only abnormal results are displayed) Labs Reviewed  COMPREHENSIVE METABOLIC PANEL - Abnormal; Notable for the following components:      Result Value   Glucose, Bld 135 (*)    All other components within normal limits  CBC - Abnormal; Notable for the following components:   WBC 10.8 (*)    All other components within normal limits  LIPASE, BLOOD  URINALYSIS, ROUTINE W REFLEX MICROSCOPIC  PREGNANCY, URINE  RAPID URINE DRUG SCREEN, HOSP PERFORMED    EKG None  Radiology No results found.  Procedures Procedures (including critical care time)  Medications Ordered in ED Medications  LORazepam (ATIVAN) injection 0.5 mg (has no administration in time range)  ondansetron (ZOFRAN) injection 4 mg (4 mg Intravenous Given 06/21/18 1525)  sodium chloride 0.9 % bolus 1,000 mL ( Intravenous Stopped 06/21/18 1642)  metoCLOPramide (REGLAN) injection 10 mg (10 mg Intravenous Given 06/21/18 1638)     Initial Impression / Assessment and Plan / ED Course  I have reviewed the triage vital signs and the nursing notes.  Pertinent labs & imaging results that were available during my care of the patient were reviewed by me and considered in my medical  decision making (see chart for details).     Patient presented to the ED with vomiting and nausea, states she ate spicy food last night. She has a history of gastric ulcers. Patient will be hydrate with fluids and given Zofran and Reglan to help with nausea. Patient states "she came here for rehydration" . I walked inside patient's room for re evaluation and patient state she does not feel better. I ordered some ativan for patient.  Patient stood up and spoke to [employer] and told her she wanted to go lay in her bed. Patient eloped at this time. No laboratory results or re assessment was done as patient eloped from the ED.   Final Clinical Impressions(s) / ED Diagnoses   Final diagnoses:  Non-intractable cyclical vomiting with nausea  ED Discharge Orders    None       Janeece Fitting, [place] 06/21/18 1749    Virgel Manifold, MD 06/22/18 [phone]

## 2018-06-21 NOTE — ED Notes (Signed)
Asked patient if she was able to give a urine sample at this time. Patient stated that she is unable to at this time.

## 2018-06-21 NOTE — ED Triage Notes (Signed)
Vomiting since 4 am. Denies pain, diarrhea.

## 2019-11-03 ENCOUNTER — Other Ambulatory Visit: Payer: Self-pay

## 2019-11-03 ENCOUNTER — Ambulatory Visit (INDEPENDENT_AMBULATORY_CARE_PROVIDER_SITE_OTHER): Payer: PRIVATE HEALTH INSURANCE | Admitting: Certified Nurse Midwife

## 2019-11-03 ENCOUNTER — Other Ambulatory Visit: Payer: Self-pay | Admitting: Certified Nurse Midwife

## 2019-11-03 ENCOUNTER — Encounter: Payer: Self-pay | Admitting: Certified Nurse Midwife

## 2019-11-03 VITALS — BP 135/81 | HR 112 | Resp 16 | Ht 65.0 in | Wt 181.0 lb

## 2019-11-03 DIAGNOSIS — Z01419 Encounter for gynecological examination (general) (routine) without abnormal findings: Secondary | ICD-10-CM | POA: Diagnosis not present

## 2019-11-03 DIAGNOSIS — N852 Hypertrophy of uterus: Secondary | ICD-10-CM

## 2019-11-03 DIAGNOSIS — N6320 Unspecified lump in the left breast, unspecified quadrant: Secondary | ICD-10-CM | POA: Diagnosis not present

## 2019-11-03 DIAGNOSIS — Z113 Encounter for screening for infections with a predominantly sexual mode of transmission: Secondary | ICD-10-CM | POA: Diagnosis not present

## 2019-11-03 DIAGNOSIS — Z124 Encounter for screening for malignant neoplasm of cervix: Secondary | ICD-10-CM

## 2019-11-03 DIAGNOSIS — Z1151 Encounter for screening for human papillomavirus (HPV): Secondary | ICD-10-CM | POA: Diagnosis not present

## 2019-11-03 NOTE — Progress Notes (Signed)
GYNECOLOGY ANNUAL PREVENTATIVE CARE ENCOUNTER NOTE  History:     Tinzlee Craker is a 43 y.o. G9P0100 female here for a new patient gynecologic exam.  Current complaints: vaginal bleeding and lump on breast. Denies abnormal discharge, pelvic pain, problems with intercourse or other gynecologic concerns. Patient request Pap smear today and fasting lab work.    Gynecologic History Patient's last menstrual period was 09/27/2019. Contraception: tubal ligation Last Pap: 2017. Results were: normal with negative HPV per patient  Patient reports having a mammogram 20+ years ago but denies having yearly screening since being 43 y.o.   Obstetric History OB History  Gravida Para Term Preterm AB Living  '2 2   1      ' SAB TAB Ectopic Multiple Live Births          2    # Outcome Date GA Lbr Len/2nd Weight Sex Delivery Anes PTL Lv  2 Para           1 Preterm             Past Medical History:  Diagnosis Date  . Constipation   . Ulcer of the stomach and intestine     Past Surgical History:  Procedure Laterality Date  . CESAREAN SECTION    . TUBAL LIGATION      Current Outpatient Medications on File Prior to Visit  Medication Sig Dispense Refill  . famotidine (PEPCID) 40 MG tablet Take 1 tablet (40 mg total) by mouth at bedtime. (Patient not taking: Reported on 11/03/2019) 30 tablet 0  . metoCLOPramide (REGLAN) 10 MG tablet Take 1 tablet (10 mg total) by mouth every 8 (eight) hours as needed for nausea or vomiting. (Patient not taking: Reported on 11/03/2019) 15 tablet 0  . ondansetron (ZOFRAN) 8 MG tablet Take 1 tablet (8 mg total) by mouth every 4 (four) hours as needed for nausea. (Patient not taking: Reported on 11/03/2019) 6 tablet 0  . promethazine (PHENERGAN) 25 MG suppository Place 1 suppository (25 mg total) rectally every 6 (six) hours as needed for nausea or vomiting. (Patient not taking: Reported on 11/03/2019) 15 each 0  . sucralfate (CARAFATE) 1 g tablet Take 1 tablet (1 g  total) by mouth 4 (four) times daily -  with meals and at bedtime. (Patient not taking: Reported on 11/03/2019) 30 tablet 0   No current facility-administered medications on file prior to visit.     Allergies  Allergen Reactions  . Penicillins Hives    hives    Social History:  reports that she has never smoked. She has never used smokeless tobacco. She reports previous alcohol use. She reports that she does not use drugs.  Family History  Problem Relation Age of Onset  . Heart attack Father     The following portions of the patient's history were reviewed and updated as appropriate: allergies, current medications, past family history, past medical history, past social history, past surgical history and problem list.  Review of Systems Pertinent items noted in HPI and remainder of comprehensive ROS otherwise negative.  Physical Exam:  BP 135/81   Pulse (!) 112   Resp 16   Ht '5\' 5"'  (1.651 m)   Wt 181 lb (82.1 kg)   LMP 09/27/2019   BMI 30.12 kg/m  CONSTITUTIONAL: Well-developed, well-nourished female in no acute distress.  HENT:  Normocephalic, atraumatic, External right and left ear normal. Oropharynx is clear and moist EYES: Conjunctivae and EOM are normal. Pupils are equal, round, and reactive  to light.  NECK: Normal range of motion, supple, no masses.  Normal thyroid.  SKIN: Skin is warm and dry. No rash noted. Not diaphoretic. No erythema. No pallor. MUSCULOSKELETAL: Normal range of motion. No tenderness.  No cyanosis, clubbing, or edema.  2+ distal pulses. NEUROLOGIC: Alert and oriented to person, place, and time. Normal reflexes, muscle tone coordination.  PSYCHIATRIC: Normal mood and affect. Normal behavior. Normal judgment and thought content. CARDIOVASCULAR: Normal heart rate noted, regular rhythm RESPIRATORY: Clear to auscultation bilaterally. Effort and breath sounds normal, no problems with respiration noted. BREASTS: Symmetric in size. No tenderness, skin  changes, nipple drainage bilaterally. 2 small masses palpated of left breast. No mass palpated on right.  Physical Exam  Pulmonary/Chest:     ABDOMEN: Soft, no distention noted.  No tenderness, rebound or guarding.  PELVIC: Normal appearing external genitalia and urethral meatus; normal appearing vaginal mucosa and cervix.  No abnormal discharge noted.  Pap smear obtained.  Uterus enlarged to 1cm under umbilicus - uterus size of grapefruit, no uterine or adnexal tenderness.   Assessment and Plan:    1. Women's annual routine gynecological examination - Abnormal well woman examination d/t breast mass and enlarged uterus  - Fasting labs obtained per patient request  - Patient reports fam hx of thyroid disease- unsure of hypo or hyper  - TSH - HgB A1c - CBC w/Diff - Cholesterol, Total - Comp Met (CMET) - Cytology - PAP( [facility])  2. Enlarged uterus - Patient reports increased pressure during menstruation - Reports having a Novasure procedure 2-3 years ago for AUB- denies being diagnosed with fibroids at that time  - Reports menstrual cycles are 7-8 days with moderate-heavy flow which has continued after procedure  - US Pelvis Complete; Future  3. Mass of breast, left - Patient reports feeling mass for the past 2 months  - Denies being seen or having a mammogram to evaluate mass  - MM DIAG BREAST TOMO BILATERAL; Future  Will follow up results of pap smear, [place] and manage accordingly. Mammogram scheduled Labs collected and will manage accordingly  Routine preventative health maintenance measures emphasized. Please refer to After Visit Summary for other counseling recommendations.      Lajean Manes, [place] for [employer], [place]

## 2019-11-03 NOTE — Patient Instructions (Signed)
Mammogram A mammogram is an X-ray of the breasts that is done to check for changes that are not normal. This test can screen for and find any changes that may suggest breast cancer. Mammograms are regularly done on women. A man may have a mammogram if he has a lump or swelling in his breast. This test can also help to find other changes and variations in the breast. Tell a doctor:  About any allergies you have.  If you have breast implants.  If you have had previous breast disease, biopsy, or surgery.  If you are breastfeeding.  If you are younger than age 4.  If you have a family history of breast cancer.  Whether you are pregnant or may be pregnant. What are the risks? Generally, this is a safe procedure. However, problems may occur, including:  Exposure to radiation. Radiation levels are very low with this test.  The results being misinterpreted.  The need for further tests.  The inability of the mammogram to detect certain cancers. What happens before the procedure?  Have this test done about 1-2 weeks after your period. This is usually when your breasts are the least tender.  If you are visiting a new doctor or clinic, send any past mammogram images to your new doctor's office.  Wash your breasts and under your arms the day of the test.  Do not use deodorants, perfumes, lotions, or powders on the day of the test.  Take off any jewelry from your neck.  Wear clothes that you can change into and out of easily. What happens during the procedure?   You will undress from the waist up. You will put on a gown.  You will stand in front of the X-ray machine.  Each breast will be placed between two plastic or glass plates. The plates will press down on your breast for a few seconds. Try to stay as relaxed as possible. This does not cause any harm to your breasts. Any discomfort you feel will be very brief.  X-rays will be taken from different angles of each breast. The  procedure may vary among doctors and hospitals. What happens after the procedure?  The mammogram will be read by a specialist (radiologist).  You may need to do certain parts of the test again. This depends on the quality of the images.  Ask when your test results will be ready. Make sure you get your test results.  You may go back to your normal activities. Summary  A mammogram is a low energy X-ray of the breasts that is done to check for abnormal changes. A man may have this test if he has a lump or swelling in his breast.  Before the procedure, tell your doctor about any breast problems that you have had in the past.  Have this test done about 1-2 weeks after your period.  For the test, each breast will be placed between two plastic or glass plates. The plates will press down on your breast for a few seconds.  The mammogram will be read by a specialist (radiologist). Ask when your test results will be ready. Make sure you get your test results. This information is not intended to replace advice given to you by your health care provider. Make sure you discuss any questions you have with your health care provider. Document Released: 02/08/2009 Document Revised: 07/03/2018 Document Reviewed: 07/03/2018 Elsevier Patient Education  2020 [employer].

## 2019-11-03 NOTE — Progress Notes (Signed)
Last pap around 3 years ago but never had an abnormal

## 2019-11-04 ENCOUNTER — Telehealth: Payer: Self-pay

## 2019-11-04 ENCOUNTER — Ambulatory Visit (INDEPENDENT_AMBULATORY_CARE_PROVIDER_SITE_OTHER): Payer: PRIVATE HEALTH INSURANCE

## 2019-11-04 DIAGNOSIS — N852 Hypertrophy of uterus: Secondary | ICD-10-CM | POA: Diagnosis not present

## 2019-11-04 LAB — COMPREHENSIVE METABOLIC PANEL
AG Ratio: 2 (calc) (ref 1.0–2.5)
ALT: 7 U/L (ref 6–29)
AST: 11 U/L (ref 10–30)
Albumin: 4.6 g/dL (ref 3.6–5.1)
Alkaline phosphatase (APISO): 51 U/L (ref 31–125)
BUN: 8 mg/dL (ref 7–25)
CO2: 26 mmol/L (ref 20–32)
Calcium: 9.7 mg/dL (ref 8.6–10.2)
Chloride: 105 mmol/L (ref 98–110)
Creat: 0.84 mg/dL (ref 0.50–1.10)
Globulin: 2.3 g/dL (calc) (ref 1.9–3.7)
Glucose, Bld: 111 mg/dL — ABNORMAL HIGH (ref 65–99)
Potassium: 4.1 mmol/L (ref 3.5–5.3)
Sodium: 140 mmol/L (ref 135–146)
Total Bilirubin: 0.6 mg/dL (ref 0.2–1.2)
Total Protein: 6.9 g/dL (ref 6.1–8.1)

## 2019-11-04 LAB — HEMOGLOBIN A1C
Hgb A1c MFr Bld: 5.1 % of total Hgb (ref <5.7)
Mean Plasma Glucose: 100 (calc)
eAG (mmol/L): 5.5 (calc)

## 2019-11-04 LAB — CBC WITH DIFFERENTIAL/PLATELET
Absolute Monocytes: 511 cells/uL (ref 200–950)
Basophils Absolute: 28 cells/uL (ref 0–200)
Basophils Relative: 0.4 %
Eosinophils Absolute: 41 cells/uL (ref 15–500)
Eosinophils Relative: 0.6 %
HCT: 39.8 % (ref 35.0–45.0)
Hemoglobin: 13.2 g/dL (ref 11.7–15.5)
Lymphs Abs: 1884 cells/uL (ref 850–3900)
MCH: 30.5 pg (ref 27.0–33.0)
MCHC: 33.2 g/dL (ref 32.0–36.0)
MCV: 91.9 fL (ref 80.0–100.0)
MPV: 10.5 fL (ref 7.5–12.5)
Monocytes Relative: 7.4 %
Neutro Abs: 4437 cells/uL (ref 1500–7800)
Neutrophils Relative %: 64.3 %
Platelets: 453 10*3/uL — ABNORMAL HIGH (ref 140–400)
RBC: 4.33 10*6/uL (ref 3.80–5.10)
RDW: 13.9 % (ref 11.0–15.0)
Total Lymphocyte: 27.3 %
WBC: 6.9 10*3/uL (ref 3.8–10.8)

## 2019-11-04 LAB — CHOLESTEROL, TOTAL: Cholesterol: 207 mg/dL — ABNORMAL HIGH (ref <200)

## 2019-11-04 LAB — TSH: TSH: 0.97 mIU/L

## 2019-11-04 NOTE — Telephone Encounter (Addendum)
Spoke with pt and she is aware of results. F/U appt made with Dr Rosana Hoes on 12/03/19  ----- Message from Lajean Manes, CNM sent at 11/04/2019  3:57 PM EST ----- Please call patient and notify of ultrasound results and the presence of multiple fibroids. Please schedule patient follow up with MD to discuss management if she wants.   Darrol Poke CNM

## 2019-11-04 NOTE — Telephone Encounter (Addendum)
Spoke with pt and she is aware of results and recommendations of exercise and diet modifications. Pt expressed understanding.  ----- Message from Lajean Manes, CNM sent at 11/04/2019 10:10 AM EST ----- Please call patient and notify of results. Encourage patient to start exercising 30 minutes at least 3 times a week - due to increased cholesterol and glucose. In addition to exercise, make modifications to diet and decrease amount of carbs and sweets she consumes. Will need labs rechecked in 6-12 months after adjustments to diet and exercise.   Darrol Poke CNM

## 2019-11-06 LAB — CYTOLOGY - PAP
Chlamydia: NEGATIVE
Comment: NEGATIVE
Comment: NEGATIVE
Comment: NEGATIVE
Comment: NORMAL
High risk HPV: NEGATIVE
Neisseria Gonorrhea: NEGATIVE
Trichomonas: NEGATIVE

## 2019-11-09 ENCOUNTER — Other Ambulatory Visit: Payer: Self-pay | Admitting: Certified Nurse Midwife

## 2019-11-09 ENCOUNTER — Ambulatory Visit
Admission: RE | Admit: 2019-11-09 | Discharge: 2019-11-09 | Disposition: A | Discharge status: Home / Self Care | Payer: PRIVATE HEALTH INSURANCE | Source: Ambulatory Visit | Attending: Certified Nurse Midwife | Admitting: Certified Nurse Midwife

## 2019-11-09 ENCOUNTER — Other Ambulatory Visit: Payer: Self-pay

## 2019-11-09 DIAGNOSIS — N6320 Unspecified lump in the left breast, unspecified quadrant: Secondary | ICD-10-CM

## 2019-11-09 DIAGNOSIS — N63 Unspecified lump in unspecified breast: Secondary | ICD-10-CM

## 2019-11-09 DIAGNOSIS — N6310 Unspecified lump in the right breast, unspecified quadrant: Secondary | ICD-10-CM

## 2019-12-03 ENCOUNTER — Other Ambulatory Visit: Payer: Self-pay

## 2019-12-03 ENCOUNTER — Ambulatory Visit (INDEPENDENT_AMBULATORY_CARE_PROVIDER_SITE_OTHER): Payer: PRIVATE HEALTH INSURANCE | Admitting: Obstetrics and Gynecology

## 2019-12-03 ENCOUNTER — Encounter: Payer: Self-pay | Admitting: Obstetrics and Gynecology

## 2019-12-03 VITALS — BP 124/83 | HR 91 | Ht 65.0 in | Wt 180.0 lb

## 2019-12-03 DIAGNOSIS — N939 Abnormal uterine and vaginal bleeding, unspecified: Secondary | ICD-10-CM | POA: Diagnosis not present

## 2019-12-03 DIAGNOSIS — N946 Dysmenorrhea, unspecified: Secondary | ICD-10-CM | POA: Diagnosis not present

## 2019-12-03 DIAGNOSIS — D259 Leiomyoma of uterus, unspecified: Secondary | ICD-10-CM | POA: Diagnosis not present

## 2019-12-03 MED ORDER — NORETHINDRONE ACETATE 5 MG PO TABS: 5.0000 mg | ORAL_TABLET | Freq: Every day | ORAL | 2 refills | Status: DC

## 2019-12-03 NOTE — Progress Notes (Signed)
GYNECOLOGY OFFICE FOLLOW UP NOTE  History:  44 y.o. G2P0100 here today for follow up for AUB. Still having periods lasting 7 days. Sometimes periods are lighter than others, sometimes heavy. Periods are very regular, occurring monthly and sometimes twice a month. Overall, lighter than it was prior to the Novasure. Feels like she is weak and sluggish while she is on her period. Has pain with periods that is unbearable and interfering with her work.    Had Novasure many years ago, at least 15 years ago.   Past Medical History:  Diagnosis Date  . Constipation   . Ulcer of the stomach and intestine     Past Surgical History:  Procedure Laterality Date  . CESAREAN SECTION    . TUBAL LIGATION       Current Outpatient Medications:  .  famotidine (PEPCID) 40 MG tablet, Take 1 tablet (40 mg total) by mouth at bedtime. (Patient not taking: Reported on 11/03/2019), Disp: 30 tablet, Rfl: 0 .  metoCLOPramide (REGLAN) 10 MG tablet, Take 1 tablet (10 mg total) by mouth every 8 (eight) hours as needed for nausea or vomiting. (Patient not taking: Reported on 11/03/2019), Disp: 15 tablet, Rfl: 0 .  ondansetron (ZOFRAN) 8 MG tablet, Take 1 tablet (8 mg total) by mouth every 4 (four) hours as needed for nausea. (Patient not taking: Reported on 11/03/2019), Disp: 6 tablet, Rfl: 0 .  promethazine (PHENERGAN) 25 MG suppository, Place 1 suppository (25 mg total) rectally every 6 (six) hours as needed for nausea or vomiting. (Patient not taking: Reported on 11/03/2019), Disp: 15 each, Rfl: 0 .  sucralfate (CARAFATE) 1 g tablet, Take 1 tablet (1 g total) by mouth 4 (four) times daily -  with meals and at bedtime. (Patient not taking: Reported on 11/03/2019), Disp: 30 tablet, Rfl: 0  The following portions of the patient's history were reviewed and updated as appropriate: allergies, current medications, past family history, past medical history, past social history, past surgical history and problem list.   Review  of Systems:  Pertinent items noted in HPI and remainder of comprehensive ROS otherwise negative.   Objective:  Physical Exam BP 124/83   Pulse 91   Ht 5\' 5"  (1.651 m)   Wt 180 lb (81.6 kg)   LMP 11/19/2019   BMI 29.95 kg/m  CONSTITUTIONAL: Well-developed, well-nourished female in no acute distress.  HENT:  Normocephalic, atraumatic. External right and left ear normal. Oropharynx is clear and moist EYES: Conjunctivae and EOM are normal. Pupils are equal, round, and reactive to light. No scleral icterus.  NECK: Normal range of motion, supple, no masses SKIN: Skin is warm and dry. No rash noted. Not diaphoretic. No erythema. No pallor. NEUROLOGIC: Alert and oriented to person, place, and time. Normal reflexes, muscle tone coordination. No cranial nerve deficit noted. PSYCHIATRIC: Normal mood and affect. Normal behavior. Normal judgment and thought content. CARDIOVASCULAR: Normal heart rate noted RESPIRATORY: Effort normal, no problems with respiration noted ABDOMEN: Soft, no distention noted.   PELVIC: deferred MUSCULOSKELETAL: Normal range of motion. No edema noted.  Labs and Imaging   Assessment & Plan:   1. Abnormal uterine bleeding (AUB) - Reviewed US findings with patient, several 4-6 cm fibroids - Recommend start with hormonal management for control of AUB and pelvic pain, reviewed different options for hormonal management, including OCPs, risks with OCPs, IUD, progestin only management - Patient feels she has been dealing with patient for too long and would like to proceed with surgical management. I  reviewed that hysterectomy may not improve her pain but would be happy to refer to a partner as I do not have OR time in the near future, she is agreeable to this - she is agreeable to trial of norethindrone in the meantime, sent to pharmacy - recommend EMB prior to surgery  2. Uterine leiomyoma, unspecified location  3. Dysmenorrhea   Routine preventative health maintenance  measures emphasized. Please refer to After Visit Summary for other counseling recommendations.   Return in about 2 weeks (around 12/17/2019).  Total face-to-face time with patient: 20 minutes. Over 50% of encounter was spent on counseling and coordination of care.  Feliz Beam, M.D. Attending Center for [employer] manager)

## 2019-12-03 NOTE — Addendum Note (Signed)
Addended by: Vivien Rota on: 12/03/2019 03:18 PM   Modules accepted: Orders

## 2019-12-10 ENCOUNTER — Ambulatory Visit (INDEPENDENT_AMBULATORY_CARE_PROVIDER_SITE_OTHER): Payer: PRIVATE HEALTH INSURANCE | Admitting: Obstetrics and Gynecology

## 2019-12-10 ENCOUNTER — Other Ambulatory Visit: Payer: Self-pay

## 2019-12-10 ENCOUNTER — Encounter: Payer: Self-pay | Admitting: Obstetrics and Gynecology

## 2019-12-10 ENCOUNTER — Other Ambulatory Visit: Payer: Self-pay | Admitting: Obstetrics and Gynecology

## 2019-12-10 VITALS — BP 124/83 | HR 91 | Resp 16 | Ht 65.0 in | Wt 180.0 lb

## 2019-12-10 DIAGNOSIS — D259 Leiomyoma of uterus, unspecified: Secondary | ICD-10-CM | POA: Diagnosis not present

## 2019-12-10 DIAGNOSIS — N939 Abnormal uterine and vaginal bleeding, unspecified: Secondary | ICD-10-CM | POA: Diagnosis not present

## 2019-12-10 MED ORDER — MEGESTROL ACETATE 40 MG PO TABS: 40.0000 mg | ORAL_TABLET | Freq: Every day | ORAL | 5 refills | Status: DC

## 2019-12-10 NOTE — Progress Notes (Signed)
44 yo P2 here to discuss surgical management of AUB, pelvic pain and fibroid uterus. Patient has had an endometrial ablation more than 10 years ago and reports a monthly heavy 7-day period and at times this period occurs twice a month. She reports severe dysmenorrhea. Patient is sexually active using BTL for contraception. Patient is without any other complaints  Past Medical History:  Diagnosis Date  . Constipation   . Ulcer of the stomach and intestine    Past Surgical History:  Procedure Laterality Date  . CESAREAN SECTION    . TUBAL LIGATION     Family History  Problem Relation Age of Onset  . Heart attack Father    Social History   Tobacco Use  . Smoking status: Never Smoker  . Smokeless tobacco: Never Used  Substance Use Topics  . Alcohol use: Not Currently  . Drug use: No   ROS See pertinent in HPI. Other systems reviewed and negative  Blood pressure 124/83, pulse 91, resp. rate 16, height 5\' 5"  (1.651 m), weight 180 lb (81.6 kg), last menstrual period 11/19/2019. GENERAL: Well-developed, well-nourished female in no acute distress.  ABDOMEN: Soft, nontender, nondistended. No organomegaly. PELVIC: Normal external female genitalia. Vagina is pink and rugated.  Normal discharge. Normal appearing cervix. Uterus is 14-weeks in size. No adnexal mass or tenderness. EXTREMITIES: No cyanosis, clubbing, or edema, 2+ distal pulses.  A/P 44 yo with AUB, fibroid uterus and pelvic pain - Discussed surgical management with TAH/BS - Risks, benefits and alternatives were explained including but not limited to risks of bleeding, infection and damage to adjacent organs. Patient verbalized understanding and all questions were answered - Endometrial biopsy performed today ENDOMETRIAL BIOPSY     The indications for endometrial biopsy were reviewed.   Risks of the biopsy including cramping, bleeding, infection, uterine perforation, inadequate specimen and need for additional procedures  were  discussed. The patient states she understands and agrees to undergo procedure today. Consent was signed. Time out was performed. Urine HCG was negative. A sterile speculum was placed in the patient's vagina and the cervix was prepped with Betadine. A single-toothed tenaculum was placed on the anterior lip of the cervix to stabilize it. The uterine cavity was sounded to a depth of 10 cm using the uterine sound. The 3 mm pipelle was introduced into the endometrial cavity without difficulty, 1 pass was made due to patient discomfort.  A  moderate amount of tissue was  sent to pathology. The instruments were removed from the patient's vagina. Minimal bleeding from the cervix was noted. The patient tolerated the procedure well.  Routine post-procedure instructions were given to the patient. The patient will follow up in two weeks to review the results and for further management.   - Rx megace provided pending TAH/BS

## 2020-01-11 ENCOUNTER — Telehealth: Payer: Self-pay | Admitting: *Deleted

## 2020-01-11 NOTE — Telephone Encounter (Signed)
Left patient an urgent message to call with an update of insurance information. The insurance on file has termed per insurance rep Cristie Hem.

## 2020-01-13 ENCOUNTER — Other Ambulatory Visit: Payer: Self-pay | Admitting: Obstetrics and Gynecology

## 2020-01-14 NOTE — Pre-Procedure Instructions (Signed)
Tammy Pittman  01/14/2020      [id number] - [place], [place] AT [facility] [address] [place] [address] Phone: [phone] Fax: [phone]    Your procedure is scheduled on Feb.23  Report to [facility] Entrance A at 7:50 A.M.  Call this number if you have problems the morning of surgery:  [phone]   Remember:  Do not eat or drink after midnight.  You may drink clear liquids until 6:50 .  Clear liquids allowed are:                    Water, Juice (non-citric and without pulp), Carbonated beverages, Clear Tea, Black Coffee only, Plain Jell-O only, Gatorade and Plain Popsicles only    Take these medicines the morning of surgery with A SIP OF WATER :               Megestrol (megace)    Do not wear jewelry, make-up or nail polish.  Do not wear lotions, powders, or perfumes, or deodorant.  Do not shave 48 hours prior to surgery.  Men may shave face and neck.  Do not bring valuables to the hospital.  [facility] is not responsible for any belongings or valuables.  Contacts, dentures or bridgework may not be worn into surgery.  Leave your suitcase in the car.  After surgery it may be brought to your room.  For patients admitted to the hospital, discharge time will be determined by your treatment team.  Patients discharged the day of surgery will not be allowed to drive home.  Special instructions: [facility]- Preparing For Surgery  Before surgery, you can play an important role. Because skin is not sterile, your skin needs to be as free of germs as possible. You can reduce the number of germs on your skin by washing with CHG (chlorahexidine gluconate) Soap before surgery.  CHG is an antiseptic cleaner which kills germs and bonds with the skin to continue killing germs even after washing.    Oral Hygiene is also important to reduce your risk of infection.  Remember - BRUSH YOUR TEETH THE MORNING OF SURGERY  WITH YOUR REGULAR TOOTHPASTE  Please do not use if you have an allergy to CHG or antibacterial soaps. If your skin becomes reddened/irritated stop using the CHG.  Do not shave (including legs and underarms) for at least 48 hours prior to first CHG shower. It is OK to shave your face.  Please follow these instructions carefully.   1. Shower the NIGHT BEFORE SURGERY and the MORNING OF SURGERY with CHG.   2. If you chose to wash your hair, wash your hair first as usual with your normal shampoo.  3. After you shampoo, rinse your hair and body thoroughly to remove the shampoo.  4. Use CHG as you would any other liquid soap. You can apply CHG directly to the skin and wash gently with a scrungie or a clean washcloth.   5. Apply the CHG Soap to your body ONLY FROM THE NECK DOWN.  Do not use on open wounds or open sores. Avoid contact with your eyes, ears, mouth and genitals (private parts). Wash Face and genitals (private parts)  with your normal soap.  6. Wash thoroughly, paying special attention to the area where your surgery will be performed.  7. Thoroughly rinse your body with warm water from the neck down.  8. DO NOT shower/wash with your normal soap after using and rinsing off the CHG Soap.  9. Pat yourself dry with a CLEAN TOWEL.  10. Wear CLEAN PAJAMAS to bed the night before surgery, wear comfortable clothes the morning of surgery  11. Place CLEAN SHEETS on your bed the night of your first shower and DO NOT SLEEP WITH PETS.    Day of Surgery:  Do not apply any deodorants/lotions.  Please wear clean clothes to the hospital/surgery center.   Remember to brush your teeth WITH YOUR REGULAR TOOTHPASTE.    Please read over the following fact sheets that you were given. Coughing and Deep Breathing and Surgical Site Infection Prevention

## 2020-01-15 ENCOUNTER — Other Ambulatory Visit (HOSPITAL_COMMUNITY)
Admission: RE | Admit: 2020-01-15 | Discharge: 2020-01-15 | Disposition: A | Discharge status: Home / Self Care | Payer: No Typology Code available for payment source | Source: Ambulatory Visit | Attending: Obstetrics and Gynecology | Admitting: Obstetrics and Gynecology

## 2020-01-15 ENCOUNTER — Encounter (HOSPITAL_COMMUNITY)
Admission: RE | Admit: 2020-01-15 | Discharge: 2020-01-15 | Disposition: A | Discharge status: Home / Self Care | Payer: No Typology Code available for payment source | Source: Ambulatory Visit | Attending: Obstetrics and Gynecology | Admitting: Obstetrics and Gynecology

## 2020-01-15 ENCOUNTER — Encounter (HOSPITAL_COMMUNITY): Payer: Self-pay

## 2020-01-15 ENCOUNTER — Other Ambulatory Visit: Payer: Self-pay

## 2020-01-15 DIAGNOSIS — Z20822 Contact with and (suspected) exposure to covid-19: Secondary | ICD-10-CM | POA: Insufficient documentation

## 2020-01-15 DIAGNOSIS — Z01812 Encounter for preprocedural laboratory examination: Secondary | ICD-10-CM | POA: Diagnosis not present

## 2020-01-15 HISTORY — DX: Sickle-cell trait: D57.3

## 2020-01-15 LAB — CBC
HCT: 40 % (ref 36.0–46.0)
Hemoglobin: 13.6 g/dL (ref 12.0–15.0)
MCH: 31.5 pg (ref 26.0–34.0)
MCHC: 34 g/dL (ref 30.0–36.0)
MCV: 92.6 fL (ref 80.0–100.0)
Platelets: 373 10*3/uL (ref 150–400)
RBC: 4.32 MIL/uL (ref 3.87–5.11)
RDW: 14.3 % (ref 11.5–15.5)
WBC: 12.2 10*3/uL — ABNORMAL HIGH (ref 4.0–10.5)
nRBC: 0 % (ref 0.0–0.2)

## 2020-01-15 LAB — SARS CORONAVIRUS 2 (TAT 6-24 HRS): SARS Coronavirus 2: NEGATIVE

## 2020-01-15 LAB — TYPE AND SCREEN
ABO/RH(D): B POS
Antibody Screen: NEGATIVE

## 2020-01-15 LAB — ABO/RH: ABO/RH(D): B POS

## 2020-01-15 NOTE — Progress Notes (Signed)
PCP - Dr. Margart Sickles Cardiologist - denies  PPM/ICD - N/A Device Orders -N/A  Rep Notified - N/A  Chest x-ray - N/A EKG - N/A Stress Test -denies  ECHO - denies Cardiac Cath - denies  Sleep Study - denies CPAP - denies  Blood Thinner Instructions:N/A Aspirin Instructions:N/A  ERAS Protcol -Protocol ordered. Pt instructed to stop clears by 0650 on DOS. PRE-SURGERY Ensure or G2- drink was not ordered by surgeon.  COVID TEST- Pt will be tested today after PAT appointment.    Anesthesia review: No  Patient denies shortness of breath, fever, cough and chest pain at PAT appointment   All instructions explained to the patient, with a verbal understanding of the material. Patient agrees to go over the instructions while at home for a better understanding. Patient also instructed to self quarantine after being tested for COVID-19. The opportunity to ask questions was provided.   Coronavirus Screening  Have you experienced the following symptoms:  Cough yes/no: No Fever (>100.38F)  yes/no: No Runny nose yes/no: No Sore throat yes/no: No Difficulty breathing/shortness of breath  yes/no: No  Have you or a family member traveled in the last 14 days and where? yes/no: No   If the patient indicates "YES" to the above questions, their PAT will be rescheduled to limit the exposure to others and, the surgeon will be notified. THE PATIENT WILL NEED TO BE ASYMPTOMATIC FOR 14 DAYS.   If the patient is not experiencing any of these symptoms, the PAT nurse will instruct them to NOT bring anyone with them to their appointment since they may have these symptoms or traveled as well.   Please remind your patients and families that hospital visitation restrictions are in effect and the importance of the restrictions.

## 2020-01-18 MED ORDER — GENTAMICIN SULFATE 40 MG/ML IJ SOLN
5.0000 mg/kg | INTRAVENOUS | Status: AC
  Administered 2020-01-19: 400 mg via INTRAVENOUS
  Filled 2020-01-18: qty 10.25

## 2020-01-18 MED ORDER — CLINDAMYCIN PHOSPHATE 900 MG/50ML IV SOLN
900.0000 mg | INTRAVENOUS | Status: AC
  Administered 2020-01-19: 900 mg via INTRAVENOUS
  Filled 2020-01-18 (×2): qty 50

## 2020-01-18 NOTE — H&P (Signed)
Tammy Pittman is an 44 y.o. female P2 with AUB and fibroid uterus here for scheduled hysterectomy. Patient with history of endometrial ablation reporting a 7 day period heavy in flow occuring irregularly. Patient is sexually active using BTL for contraception. Patient is without any complaints  Pertinent Gynecological History: Menses: irregular Bleeding: dysfunctional uterine bleeding Contraception: tubal ligation Last mammogram: normal Date: 12/20 Last pap: abnormal: LGSIL Date: 12/20 OB History:P2   Menstrual History:  Patient's last menstrual period was 01/16/2020.    Past Medical History:  Diagnosis Date  . Constipation   . Sickle cell trait ([place])   . Ulcer of the stomach and intestine     Past Surgical History:  Procedure Laterality Date  . CESAREAN SECTION    . NOVASURE ABLATION    . TUBAL LIGATION      Family History  Problem Relation Age of Onset  . Heart attack Father     Social History:  reports that she has been smoking cigarettes. She has never used smokeless tobacco. She reports current alcohol use of about 3.0 standard drinks of alcohol per week. She reports that she does not use drugs.  Allergies:  Allergies  Allergen Reactions  . Penicillins Hives    Did it involve swelling of the face/tongue/throat, SOB, or low BP? Unknown Did it involve sudden or severe rash/hives, skin peeling, or any reaction on the inside of your mouth or nose? Yes Did you need to seek medical attention at a hospital or doctor's office? Unknown When did it last happen?CHILDHOOD REACTION. If all above answers are "NO", may proceed with cephalosporin use.     Medications Prior to Admission  Medication Sig Dispense Refill Last Dose  . megestrol (MEGACE) 40 MG tablet Take 1 tablet (40 mg total) by mouth daily. Can increase to two tablets twice a day in the event of heavy bleeding 60 tablet 5 Past Week at Unknown time  . norethindrone (AYGESTIN) 5 MG tablet Take 1 tablet (5  mg total) by mouth daily. (Patient not taking: Reported on 12/10/2019) 30 tablet 2     Review of Systems See pertinent in HPI Blood pressure (!) 88/49, pulse 84, temperature 98.5 F (36.9 C), resp. rate 11, height 5\' 5"  (1.651 m), weight 78.9 kg, last menstrual period 01/16/2020, SpO2 100 %. Physical Exam GENERAL: Well-developed, well-nourished female in no acute distress.  LUNGS: Clear to auscultation bilaterally.  HEART: Regular rate and rhythm. ABDOMEN: Soft, nontender, nondistended. No organomegaly. PELVIC: Deferred to OR EXTREMITIES: No cyanosis, clubbing, or edema, 2+ distal pulses.  Results for orders placed or performed during the hospital encounter of 01/19/20 (from the past 24 hour(s))  Pregnancy, urine POC     Status: None   Collection Time: 01/19/20  8:20 AM  Result Value Ref Range   Preg Test, Ur NEGATIVE NEGATIVE    No results found.  Assessment/Plan: 44 yo with DUB here for scheduled hysterectomy - Risks, benefits and alternatives were explained including but not limited to risks of bleeding, infection and damage to adjacent organs. Patient verbalized understanding and all questions were answered. Patient will undergo an abdominal hysterectomy with bilateral salpingectomy  Tammy Pittman 01/19/2020, 9:40 AM

## 2020-01-19 ENCOUNTER — Ambulatory Visit (HOSPITAL_BASED_OUTPATIENT_CLINIC_OR_DEPARTMENT_OTHER): Admit: 2020-01-19 | Payer: PRIVATE HEALTH INSURANCE | Admitting: Obstetrics and Gynecology

## 2020-01-19 ENCOUNTER — Encounter (HOSPITAL_COMMUNITY): Payer: Self-pay | Admitting: Obstetrics and Gynecology

## 2020-01-19 ENCOUNTER — Other Ambulatory Visit: Payer: Self-pay

## 2020-01-19 ENCOUNTER — Inpatient Hospital Stay (HOSPITAL_COMMUNITY)
Admission: RE | Admit: 2020-01-19 | Discharge: 2020-01-21 | DRG: 743 | Disposition: A | Discharge status: Home / Self Care | Payer: No Typology Code available for payment source | Attending: Obstetrics and Gynecology | Admitting: Obstetrics and Gynecology

## 2020-01-19 ENCOUNTER — Inpatient Hospital Stay (HOSPITAL_COMMUNITY): Payer: PRIVATE HEALTH INSURANCE | Admitting: Anesthesiology

## 2020-01-19 ENCOUNTER — Encounter (HOSPITAL_COMMUNITY)
Admission: RE | Disposition: A | Discharge status: Home / Self Care | Payer: Self-pay | Source: Home / Self Care | Attending: Obstetrics and Gynecology

## 2020-01-19 ENCOUNTER — Encounter (HOSPITAL_BASED_OUTPATIENT_CLINIC_OR_DEPARTMENT_OTHER): Payer: Self-pay

## 2020-01-19 DIAGNOSIS — D259 Leiomyoma of uterus, unspecified: Principal | ICD-10-CM | POA: Diagnosis present

## 2020-01-19 DIAGNOSIS — N920 Excessive and frequent menstruation with regular cycle: Secondary | ICD-10-CM

## 2020-01-19 DIAGNOSIS — Z90710 Acquired absence of both cervix and uterus: Secondary | ICD-10-CM | POA: Diagnosis present

## 2020-01-19 DIAGNOSIS — Z8249 Family history of ischemic heart disease and other diseases of the circulatory system: Secondary | ICD-10-CM

## 2020-01-19 DIAGNOSIS — Z880 Allergy status to penicillin: Secondary | ICD-10-CM

## 2020-01-19 DIAGNOSIS — F17210 Nicotine dependence, cigarettes, uncomplicated: Secondary | ICD-10-CM | POA: Diagnosis present

## 2020-01-19 DIAGNOSIS — D573 Sickle-cell trait: Secondary | ICD-10-CM | POA: Diagnosis present

## 2020-01-19 HISTORY — PX: ABDOMINAL HYSTERECTOMY: SHX81

## 2020-01-19 HISTORY — PX: HYSTERECTOMY ABDOMINAL WITH SALPINGECTOMY: SHX6725

## 2020-01-19 LAB — CBC
HCT: 39.5 % (ref 36.0–46.0)
Hemoglobin: 13.5 g/dL (ref 12.0–15.0)
MCH: 31.5 pg (ref 26.0–34.0)
MCHC: 34.2 g/dL (ref 30.0–36.0)
MCV: 92.1 fL (ref 80.0–100.0)
Platelets: 380 10*3/uL (ref 150–400)
RBC: 4.29 MIL/uL (ref 3.87–5.11)
RDW: 13.6 % (ref 11.5–15.5)
WBC: 19.7 10*3/uL — ABNORMAL HIGH (ref 4.0–10.5)
nRBC: 0 % (ref 0.0–0.2)

## 2020-01-19 LAB — CREATININE, SERUM
Creatinine, Ser: 0.87 mg/dL (ref 0.44–1.00)
GFR calc Af Amer: >60 mL/min (ref >60)
GFR calc non Af Amer: >60 mL/min (ref >60)

## 2020-01-19 LAB — POCT PREGNANCY, URINE: Preg Test, Ur: NEGATIVE

## 2020-01-19 SURGERY — HYSTERECTOMY, TOTAL, ABDOMINAL, WITH SALPINGECTOMY
Anesthesia: Choice | Laterality: Bilateral

## 2020-01-19 SURGERY — HYSTERECTOMY, TOTAL, ABDOMINAL, WITH SALPINGECTOMY
Anesthesia: General | Site: Abdomen | Laterality: Bilateral

## 2020-01-19 MED ORDER — FENTANYL CITRATE (PF) 100 MCG/2ML IJ SOLN
INTRAMUSCULAR | Status: AC
  Filled 2020-01-19: qty 4

## 2020-01-19 MED ORDER — NALOXONE HCL 0.4 MG/ML IJ SOLN: 0.4000 mg | INTRAMUSCULAR | Status: DC | PRN

## 2020-01-19 MED ORDER — ONDANSETRON HCL 4 MG/2ML IJ SOLN
INTRAMUSCULAR | Status: AC
  Filled 2020-01-19: qty 4

## 2020-01-19 MED ORDER — ACETAMINOPHEN 500 MG PO TABS: 1000.0000 mg | ORAL_TABLET | Freq: Once | ORAL | Status: AC

## 2020-01-19 MED ORDER — ROCURONIUM BROMIDE 10 MG/ML (PF) SYRINGE
PREFILLED_SYRINGE | INTRAVENOUS | Status: AC
  Filled 2020-01-19: qty 20

## 2020-01-19 MED ORDER — HYDROMORPHONE 1 MG/ML IV SOLN
INTRAVENOUS | Status: AC
  Filled 2020-01-19: qty 30

## 2020-01-19 MED ORDER — LACTATED RINGERS IV SOLN: INTRAVENOUS | Status: DC

## 2020-01-19 MED ORDER — BUPIVACAINE-EPINEPHRINE (PF) 0.25% -1:200000 IJ SOLN
INTRAMUSCULAR | Status: DC | PRN
  Administered 2020-01-19 (×2): 30 mL

## 2020-01-19 MED ORDER — KETAMINE HCL 10 MG/ML IJ SOLN
INTRAMUSCULAR | Status: DC | PRN
  Administered 2020-01-19: 40 mg via INTRAVENOUS

## 2020-01-19 MED ORDER — KETAMINE HCL 50 MG/5ML IJ SOSY
PREFILLED_SYRINGE | INTRAMUSCULAR | Status: AC
  Filled 2020-01-19: qty 5

## 2020-01-19 MED ORDER — GABAPENTIN 100 MG PO CAPS
100.0000 mg | ORAL_CAPSULE | Freq: Three times a day (TID) | ORAL | Status: DC
  Administered 2020-01-19 – 2020-01-21 (×6): 100 mg via ORAL
  Filled 2020-01-19 (×6): qty 1

## 2020-01-19 MED ORDER — FENTANYL CITRATE (PF) 100 MCG/2ML IJ SOLN: 100.0000 ug | Freq: Once | INTRAMUSCULAR | Status: AC

## 2020-01-19 MED ORDER — DIPHENHYDRAMINE HCL 50 MG/ML IJ SOLN: 12.5000 mg | Freq: Four times a day (QID) | INTRAMUSCULAR | Status: DC | PRN

## 2020-01-19 MED ORDER — PROPOFOL 10 MG/ML IV BOLUS
INTRAVENOUS | Status: DC | PRN
  Administered 2020-01-19: 200 mg via INTRAVENOUS

## 2020-01-19 MED ORDER — SUGAMMADEX SODIUM 200 MG/2ML IV SOLN
INTRAVENOUS | Status: DC | PRN
  Administered 2020-01-19: 200 mg via INTRAVENOUS

## 2020-01-19 MED ORDER — PROPOFOL 10 MG/ML IV BOLUS
INTRAVENOUS | Status: AC
  Filled 2020-01-19: qty 20

## 2020-01-19 MED ORDER — CLONIDINE HCL (ANALGESIA) 100 MCG/ML EP SOLN
EPIDURAL | Status: DC | PRN
  Administered 2020-01-19 (×2): 50 ug

## 2020-01-19 MED ORDER — HEMOSTATIC AGENTS (NO CHARGE) OPTIME
TOPICAL | Status: DC | PRN
  Administered 2020-01-19: 1 via TOPICAL

## 2020-01-19 MED ORDER — MIDAZOLAM HCL 2 MG/2ML IJ SOLN
INTRAMUSCULAR | Status: AC
  Filled 2020-01-19: qty 2

## 2020-01-19 MED ORDER — ROCURONIUM BROMIDE 10 MG/ML (PF) SYRINGE
PREFILLED_SYRINGE | INTRAVENOUS | Status: DC | PRN
  Administered 2020-01-19: 50 mg via INTRAVENOUS

## 2020-01-19 MED ORDER — DOCUSATE SODIUM 100 MG PO CAPS
100.0000 mg | ORAL_CAPSULE | Freq: Two times a day (BID) | ORAL | Status: DC
  Administered 2020-01-19 – 2020-01-21 (×5): 100 mg via ORAL
  Filled 2020-01-19 (×5): qty 1

## 2020-01-19 MED ORDER — ENOXAPARIN SODIUM 40 MG/0.4ML ~~LOC~~ SOLN
40.0000 mg | SUBCUTANEOUS | Status: DC
  Administered 2020-01-20 – 2020-01-21 (×2): 40 mg via SUBCUTANEOUS
  Filled 2020-01-19 (×2): qty 0.4

## 2020-01-19 MED ORDER — CELECOXIB 200 MG PO CAPS
ORAL_CAPSULE | ORAL | Status: AC
  Administered 2020-01-19: 200 mg via ORAL
  Filled 2020-01-19: qty 1

## 2020-01-19 MED ORDER — BUPIVACAINE HCL (PF) 0.25 % IJ SOLN
INTRAMUSCULAR | Status: AC
  Filled 2020-01-19: qty 30

## 2020-01-19 MED ORDER — ONDANSETRON HCL 4 MG/2ML IJ SOLN
INTRAMUSCULAR | Status: DC | PRN
  Administered 2020-01-19: 4 mg via INTRAVENOUS

## 2020-01-19 MED ORDER — PHENYLEPHRINE HCL-NACL 10-0.9 MG/250ML-% IV SOLN
INTRAVENOUS | Status: DC | PRN
  Administered 2020-01-19: 15 ug/min via INTRAVENOUS

## 2020-01-19 MED ORDER — ONDANSETRON HCL 4 MG/2ML IJ SOLN: 4.0000 mg | Freq: Four times a day (QID) | INTRAMUSCULAR | Status: DC | PRN

## 2020-01-19 MED ORDER — SODIUM CHLORIDE 0.9% FLUSH: 9.0000 mL | INTRAVENOUS | Status: DC | PRN

## 2020-01-19 MED ORDER — DIPHENHYDRAMINE HCL 12.5 MG/5ML PO ELIX
12.5000 mg | ORAL_SOLUTION | Freq: Four times a day (QID) | ORAL | Status: DC | PRN
  Filled 2020-01-19: qty 5

## 2020-01-19 MED ORDER — DEXAMETHASONE SODIUM PHOSPHATE 10 MG/ML IJ SOLN
INTRAMUSCULAR | Status: DC | PRN
  Administered 2020-01-19: 10 mg via INTRAVENOUS

## 2020-01-19 MED ORDER — LIDOCAINE 2% (20 MG/ML) 5 ML SYRINGE
INTRAMUSCULAR | Status: DC | PRN
  Administered 2020-01-19: 20 mg via INTRAVENOUS

## 2020-01-19 MED ORDER — BUPIVACAINE HCL (PF) 0.25 % IJ SOLN
INTRAMUSCULAR | Status: DC | PRN
  Administered 2020-01-19: 30 mL

## 2020-01-19 MED ORDER — FENTANYL CITRATE (PF) 100 MCG/2ML IJ SOLN
25.0000 ug | INTRAMUSCULAR | Status: DC | PRN
  Administered 2020-01-19 (×4): 50 ug via INTRAVENOUS

## 2020-01-19 MED ORDER — ACETAMINOPHEN 500 MG PO TABS
ORAL_TABLET | ORAL | Status: AC
  Administered 2020-01-19: 1000 mg via ORAL
  Filled 2020-01-19: qty 2

## 2020-01-19 MED ORDER — PHENYLEPHRINE HCL (PRESSORS) 10 MG/ML IV SOLN
INTRAVENOUS | Status: DC | PRN
  Administered 2020-01-19: 80 ug via INTRAVENOUS

## 2020-01-19 MED ORDER — CELECOXIB 200 MG PO CAPS: 200.0000 mg | ORAL_CAPSULE | Freq: Once | ORAL | Status: AC

## 2020-01-19 MED ORDER — LIDOCAINE 2% (20 MG/ML) 5 ML SYRINGE
INTRAMUSCULAR | Status: AC
  Filled 2020-01-19: qty 10

## 2020-01-19 MED ORDER — ONDANSETRON HCL 4 MG/2ML IJ SOLN: 4.0000 mg | Freq: Once | INTRAMUSCULAR | Status: DC | PRN

## 2020-01-19 MED ORDER — HYDROMORPHONE 1 MG/ML IV SOLN
INTRAVENOUS | Status: DC
  Administered 2020-01-19: 4.2 mg via INTRAVENOUS
  Administered 2020-01-19: 30 mg via INTRAVENOUS
  Administered 2020-01-19: 2.4 mg via INTRAVENOUS
  Administered 2020-01-19: 3.8 mg via INTRAVENOUS
  Administered 2020-01-20: 3 mg via INTRAVENOUS
  Administered 2020-01-20: 3.8 mg via INTRAVENOUS

## 2020-01-19 MED ORDER — SENNOSIDES-DOCUSATE SODIUM 8.6-50 MG PO TABS
1.0000 | ORAL_TABLET | Freq: Two times a day (BID) | ORAL | Status: DC
  Administered 2020-01-19 – 2020-01-21 (×4): 1 via ORAL
  Filled 2020-01-19 (×4): qty 1

## 2020-01-19 MED ORDER — FENTANYL CITRATE (PF) 250 MCG/5ML IJ SOLN
INTRAMUSCULAR | Status: DC | PRN
  Administered 2020-01-19: 50 ug via INTRAVENOUS
  Administered 2020-01-19: 100 ug via INTRAVENOUS

## 2020-01-19 MED ORDER — DEXAMETHASONE SODIUM PHOSPHATE 10 MG/ML IJ SOLN
INTRAMUSCULAR | Status: AC
  Filled 2020-01-19: qty 2

## 2020-01-19 MED ORDER — MIDAZOLAM HCL 2 MG/2ML IJ SOLN: 2.0000 mg | Freq: Once | INTRAMUSCULAR | Status: AC

## 2020-01-19 MED ORDER — FENTANYL CITRATE (PF) 250 MCG/5ML IJ SOLN
INTRAMUSCULAR | Status: AC
  Filled 2020-01-19: qty 5

## 2020-01-19 MED ORDER — FENTANYL CITRATE (PF) 100 MCG/2ML IJ SOLN
INTRAMUSCULAR | Status: AC
  Administered 2020-01-19: 100 ug via INTRAVENOUS
  Filled 2020-01-19: qty 2

## 2020-01-19 MED ORDER — SCOPOLAMINE 1 MG/3DAYS TD PT72
MEDICATED_PATCH | TRANSDERMAL | Status: AC
  Filled 2020-01-19: qty 2

## 2020-01-19 MED ORDER — MIDAZOLAM HCL 2 MG/2ML IJ SOLN
INTRAMUSCULAR | Status: AC
  Administered 2020-01-19: 2 mg via INTRAVENOUS
  Filled 2020-01-19: qty 2

## 2020-01-19 MED ORDER — SCOPOLAMINE 1 MG/3DAYS TD PT72
MEDICATED_PATCH | TRANSDERMAL | Status: DC | PRN
  Administered 2020-01-19: 1 via TRANSDERMAL

## 2020-01-19 SURGICAL SUPPLY — 39 items
CANISTER SUCT 3000ML PPV (MISCELLANEOUS) ×3 IMPLANT
COVER WAND RF STERILE (DRAPES) ×3 IMPLANT
DECANTER SPIKE VIAL GLASS SM (MISCELLANEOUS) IMPLANT
DRAPE CESAREAN BIRTH W POUCH (DRAPES) ×3 IMPLANT
DRAPE WARM FLUID 44X44 (DRAPES) ×2 IMPLANT
DRSG OPSITE POSTOP 4X10 (GAUZE/BANDAGES/DRESSINGS) ×3 IMPLANT
DURAPREP 26ML APPLICATOR (WOUND CARE) ×3 IMPLANT
GAUZE 4X4 16PLY RFD (DISPOSABLE) ×3 IMPLANT
GAUZE SPONGE 4X4 12PLY STRL LF (GAUZE/BANDAGES/DRESSINGS) ×3 IMPLANT
GLOVE BIOGEL PI IND STRL 6.5 (GLOVE) ×1 IMPLANT
GLOVE BIOGEL PI IND STRL 7.0 (GLOVE) ×2 IMPLANT
GLOVE BIOGEL PI INDICATOR 6.5 (GLOVE) ×2
GLOVE BIOGEL PI INDICATOR 7.0 (GLOVE) ×4
GLOVE SURG SS PI 6.0 STRL IVOR (GLOVE) ×3 IMPLANT
GOWN STRL REUS W/ TWL LRG LVL3 (GOWN DISPOSABLE) ×3 IMPLANT
GOWN STRL REUS W/TWL LRG LVL3 (GOWN DISPOSABLE) ×6
HEMOSTAT ARISTA ABSORB 3G PWDR (HEMOSTASIS) ×2 IMPLANT
HIBICLENS CHG 4% 4OZ BTL (MISCELLANEOUS) ×3 IMPLANT
KIT TURNOVER KIT B (KITS) ×3 IMPLANT
NEEDLE HYPO 22GX1.5 SAFETY (NEEDLE) ×3 IMPLANT
NS IRRIG 1000ML POUR BTL (IV SOLUTION) ×3 IMPLANT
PACK ABDOMINAL GYN (CUSTOM PROCEDURE TRAY) ×3 IMPLANT
PAD ABD 8X10 STRL (GAUZE/BANDAGES/DRESSINGS) ×2 IMPLANT
PAD ABD 8X7 1/2 STERILE (GAUZE/BANDAGES/DRESSINGS) ×3 IMPLANT
PAD ARMBOARD 7.5X6 YLW CONV (MISCELLANEOUS) ×3 IMPLANT
PAD OB MATERNITY 4.3X12.25 (PERSONAL CARE ITEMS) ×3 IMPLANT
SEPRAFILM MEMBRANE 5X6 (MISCELLANEOUS) IMPLANT
SPECIMEN JAR MEDIUM (MISCELLANEOUS) ×3 IMPLANT
SPONGE LAP 18X18 RF (DISPOSABLE) ×2 IMPLANT
SUT VIC AB 0 CT1 18XCR BRD8 (SUTURE) ×2 IMPLANT
SUT VIC AB 0 CT1 36 (SUTURE) ×6 IMPLANT
SUT VIC AB 0 CT1 8-18 (SUTURE) ×6
SUT VIC AB 3-0 SH 27 (SUTURE) ×2
SUT VIC AB 3-0 SH 27X BRD (SUTURE) IMPLANT
SUT VIC AB 4-0 KS 27 (SUTURE) ×3 IMPLANT
SUT VICRYL 0 TIES 12 18 (SUTURE) ×3 IMPLANT
SYR CONTROL 10ML LL (SYRINGE) ×3 IMPLANT
TOWEL GREEN STERILE FF (TOWEL DISPOSABLE) ×6 IMPLANT
TRAY FOLEY W/BAG SLVR 14FR (SET/KITS/TRAYS/PACK) ×3 IMPLANT

## 2020-01-19 NOTE — Anesthesia Procedure Notes (Signed)
Procedure Name: Intubation Date/Time: 01/19/2020 10:21 AM Performed by: Clearnce Sorrel, CRNA Pre-anesthesia Checklist: Patient identified, Emergency Drugs available, Suction available, Patient being monitored and Timeout performed Patient Re-evaluated:Patient Re-evaluated prior to induction Oxygen Delivery Method: Circle system utilized Preoxygenation: Pre-oxygenation with 100% oxygen Induction Type: IV induction Ventilation: Mask ventilation without difficulty Laryngoscope Size: Mac and 3 Grade View: Grade I Tube type: Oral Tube size: 7.0 mm Number of attempts: 1 Airway Equipment and Method: Stylet Placement Confirmation: ETT inserted through vocal cords under direct vision,  positive ETCO2 and breath sounds checked- equal and bilateral Secured at: 23 cm Tube secured with: Tape Dental Injury: Teeth and Oropharynx as per pre-operative assessment

## 2020-01-19 NOTE — Transfer of Care (Signed)
Immediate Anesthesia Transfer of Care Note  Patient: Tammy Pittman  Procedure(s) Performed: HYSTERECTOMY ABDOMINAL WITH SALPINGECTOMY (Bilateral Abdomen)  Patient Location: PACU  Anesthesia Type:General  Level of Consciousness: awake, alert  and oriented  Airway & Oxygen Therapy: Patient Spontanous Breathing and Patient connected to nasal cannula oxygen  Post-op Assessment: Report given to RN and Post -op Vital signs reviewed and stable  Post vital signs: Reviewed and stable  Last Vitals:  Vitals Value Taken Time  BP    Temp    Pulse 82 01/19/20 1158  Resp 13 01/19/20 1158  SpO2 100 % 01/19/20 1158  Vitals shown include unvalidated device data.  Last Pain:  Vitals:   01/19/20 0825  PainSc: 0-No pain         Complications: No apparent anesthesia complications

## 2020-01-19 NOTE — Anesthesia Preprocedure Evaluation (Signed)
Anesthesia Evaluation  Patient identified by MRN, date of birth, ID band Patient awake    Reviewed: Allergy & Precautions, NPO status , Patient's Chart, lab work & pertinent test results  Airway Mallampati: II  TM Distance: >3 FB     Dental  (+) Dental Advisory Given   Pulmonary Current Smoker and Patient abstained from smoking.,    breath sounds clear to auscultation       Cardiovascular negative cardio ROS   Rhythm:Regular Rate:Normal     Neuro/Psych negative neurological ROS     GI/Hepatic Neg liver ROS, PUD,   Endo/Other  negative endocrine ROS  Renal/GU negative Renal ROS     Musculoskeletal   Abdominal   Peds  Hematology negative hematology ROS (+)   Anesthesia Other Findings   Reproductive/Obstetrics                             Anesthesia Physical Anesthesia Plan  ASA: II  Anesthesia Plan: General   Post-op Pain Management:  Regional for Post-op pain   Induction: Intravenous  PONV Risk Score and Plan: 3 and Dexamethasone, Ondansetron, Scopolamine patch - Pre-op, Treatment may vary due to age or medical condition and Midazolam  Airway Management Planned: Oral ETT  Additional Equipment:   Intra-op Plan:   Post-operative Plan: Extubation in OR  Informed Consent: I have reviewed the patients History and Physical, chart, labs and discussed the procedure including the risks, benefits and alternatives for the proposed anesthesia with the patient or authorized representative who has indicated his/her understanding and acceptance.     Dental advisory given  Plan Discussed with: CRNA  Anesthesia Plan Comments:         Anesthesia Quick Evaluation

## 2020-01-19 NOTE — Op Note (Addendum)
Tammy Pittman PROCEDURE DATE: 01/19/2020  PREOPERATIVE DIAGNOSIS:  44 y.o. G2P0100 with symptomatic fibroids, menorrhagia POSTOPERATIVE DIAGNOSIS:  Same SURGEON:   Mora Bellman, M.D. ASSISTANT:   Verita Schneiders, M.D OPERATION:  Total abdominal hysterectomy, and bilateral salpingectomy ANESTHESIA:  General endotracheal.  INDICATIONS: The patient is a 44 y.o. G2P0100 with history of symptomatic uterine fibroids/menorrhagia. The patient made a decision to undergo definite surgical treatment. On the preoperative visit, the risks, benefits, indications, and alternatives of the procedure were reviewed with the patient.  On the day of surgery, the risks of surgery were again discussed with the patient including but not limited to: bleeding which may require transfusion or reoperation; infection which may require antibiotics; injury to bowel, bladder, ureters or other surrounding organs; need for additional procedures; thromboembolic phenomenon, incisional problems and other postoperative/anesthesia complications. Written informed consent was obtained.    OPERATIVE FINDINGS: A 12 week size uterus with normal tubes and ovaries bilaterally.  ESTIMATED BLOOD LOSS: 100 ml FLUIDS:  900 ml of Lactated Ringers URINE OUTPUT:  100 ml of clear yellow urine. SPECIMENS:  Uterus and bilateral tubes sent to pathology COMPLICATIONS:  None immediate.   DESCRIPTION OF PROCEDURE: The patient received intravenous antibiotics and had sequential compression devices applied to her lower extremities while in the preoperative area.   She was taken to the operating room where anesthesia was induced and found to be adequate. The patient was placed in supine position. The abdomen and perineum were prepped and draped in a sterile manner, and a Foley catheter was inserted into the bladder and attached to Derionna Salvador drainage. After an adequate timeout was performed, a Pfannensteil skin incision was made. This incision was taken  down to the fascia using electrocautery with care given to maintain good hemostasis. The fascia was incised in the midline and the fascial incision was then extended bilaterally using mayo scissors. The fascia was then dissected off the underlying rectus muscles using blunt and sharp dissection. The rectus muscles were split bluntly in the midline and the peritoneum entered sharply without complication. This peritoneal incision was then extended superiorly and inferiorly with care given to prevent bowel or bladder injury. Upon entry into the abdominal cavity, the upper abdomen was inspected and found to be normal. Attention was then turned to the pelvis. A retractor was placed into the incision, and the bowel was packed away with moist laparotomy sponges. The uterus at this point was noted to be mobilized. The round ligaments on each side were clamped, suture ligated, and transected with electrocautery allowing entry into the broad ligament. The anterior and posterior leaves of the broad ligament were separated and the ureters were inspected to be safely away from the area of dissection bilaterally. A hole was created in the clear portion of the posterior broad ligament. Adnexae were clamped on the patient's right side. This pedicle was then clamped, cut, and doubly suture ligated with good hemostasis. This procedure was repeated in an identical fashion on the left site allowing for both adnexa to remain in place.   A bladder flap was then created across the anterior leaf of the broad ligament and the bladder was then bluntly dissected off the lower uterine segment and cervix with good hemostasis. The uterine arteries were then skeletonized bilaterally and then clamped, cut, and ligated with care given to prevent ureteral injury. The uterosacral ligaments were then clamped, cut, and ligated bilaterally. Finally, the cardinal ligaments were clamped, cut, and ligated bilaterally.  Acutely curved clamps were placed  across the vagina just under the cervix, and the specimen was amputated and sent to pathology. The vaginal cuff was closed with a series of interrupted 0 Vicryl figure-of-eight sutures with care given to incorporate the anterior pubocervical fascia and the posterior rectovaginal fascia.  The vaginal cuff angles were noted to have good hemostasis. The fallopian tubes was grasped with a babcock clamp and a kelly. It was transected and suture ligated, allowing for bilateral salpingectomy. The pelvis was irrigated and hemostasis was reconfirmed at all pedicles and along the pelvic sidewall.  All laparotomy sponges and instruments were removed from the abdomen. The skin was closed with a 4-0 Vicryl subcuticular stitch. Sponge, lap, needle, and instrument counts were correct times two. The patient was taken to the recovery area awake, extubated and in stable condition.  An experienced assistant was required given the standard of surgical care given the complexity of the case.  This assistant was needed for exposure, dissection, suctioning, retraction, instrument exchange, and for overall help during the procedure.

## 2020-01-19 NOTE — Progress Notes (Signed)
No c/o incisional pain / has consistently said : I feel pressure " in her lower badomen and like she needs to belch--> oob to BR, no flatus/ moves strongly yet slowly

## 2020-01-19 NOTE — Anesthesia Procedure Notes (Signed)
Anesthesia Regional Block: TAP block   Pre-Anesthetic Checklist: ,, timeout performed, Correct Patient, Correct Site, Correct Laterality, Correct Procedure, Correct Position, site marked, Risks and benefits discussed,  Surgical consent,  Pre-op evaluation,  At surgeon's request and post-op pain management  Laterality: Right and Left  Prep: chloraprep       Needles:  Injection technique: Single-shot  Needle Type: Echogenic Needle     Needle Length: 9cm  Needle Gauge: 21     Additional Needles:   Procedures:,,,, ultrasound used (permanent image in chart),,,,  Narrative:  Start time: 01/19/2020 9:12 AM End time: 01/19/2020 9:25 AM Injection made incrementally with aspirations every 5 mL.  Performed by: Personally  Anesthesiologist: Suzette Battiest, MD

## 2020-01-20 ENCOUNTER — Encounter: Payer: Self-pay | Admitting: *Deleted

## 2020-01-20 LAB — CBC
HCT: 35.1 % — ABNORMAL LOW (ref 36.0–46.0)
Hemoglobin: 12.1 g/dL (ref 12.0–15.0)
MCH: 31.8 pg (ref 26.0–34.0)
MCHC: 34.5 g/dL (ref 30.0–36.0)
MCV: 92.1 fL (ref 80.0–100.0)
Platelets: 364 10*3/uL (ref 150–400)
RBC: 3.81 MIL/uL — ABNORMAL LOW (ref 3.87–5.11)
RDW: 13.6 % (ref 11.5–15.5)
WBC: 12.4 10*3/uL — ABNORMAL HIGH (ref 4.0–10.5)
nRBC: 0 % (ref 0.0–0.2)

## 2020-01-20 LAB — SURGICAL PATHOLOGY

## 2020-01-20 MED ORDER — SODIUM CHLORIDE 0.9% FLUSH
3.0000 mL | Freq: Two times a day (BID) | INTRAVENOUS | Status: DC
  Administered 2020-01-20: 3 mL via INTRAVENOUS

## 2020-01-20 MED ORDER — OXYCODONE-ACETAMINOPHEN 5-325 MG PO TABS
1.0000 | ORAL_TABLET | ORAL | Status: DC | PRN
  Administered 2020-01-20 – 2020-01-21 (×6): 2 via ORAL
  Filled 2020-01-20 (×6): qty 2

## 2020-01-20 MED ORDER — IBUPROFEN 600 MG PO TABS
600.0000 mg | ORAL_TABLET | Freq: Four times a day (QID) | ORAL | Status: DC
  Administered 2020-01-20 – 2020-01-21 (×5): 600 mg via ORAL
  Filled 2020-01-20 (×2): qty 1
  Filled 2020-01-20: qty 3
  Filled 2020-01-20 (×2): qty 1

## 2020-01-20 NOTE — Progress Notes (Signed)
1 Day Post-Op Procedure(s) (LRB): HYSTERECTOMY ABDOMINAL WITH SALPINGECTOMY (Bilateral)  Subjective: Patient reports feeling well and pain is well controlled on oral medication. She is ambulated and tolerating a regular diet. Patient is voiding. Patient denies feeling dizzy or lightheaded    Objective: I have reviewed patient's vital signs.  General: alert, cooperative and no distress Resp: clear to auscultation bilaterally Cardio: regular rate and rhythm GI: soft, non-tender; bowel sounds normal; no masses,  no organomegaly and incision: clean, dry and intact Extremities: extremities normal, atraumatic, no cyanosis or edema  CBC Latest Ref Rng & Units 01/20/2020 01/19/2020 01/15/2020  WBC 4.0 - 10.5 K/uL 12.4(H) 19.7(H) 12.2(H)  Hemoglobin 12.0 - 15.0 g/dL 12.1 13.5 13.6  Hematocrit 36.0 - 46.0 % 35.1(L) 39.5 40.0  Platelets 150 - 400 K/uL 364 380 373    Assessment: s/p Procedure(s): HYSTERECTOMY ABDOMINAL WITH SALPINGECTOMY (Bilateral): stable, progressing well and tolerating diet  Plan: Encourage ambulation Advance to PO medication Discontinue IV fluids  LOS: 1 day    Tammy Pittman 01/20/2020, 12:10 PM

## 2020-01-20 NOTE — Progress Notes (Signed)
Patient arrived to 6N4, Aox4, ambulatory, assessed honeycomb dressing, clean dry, and intact with some minimal old drainage, VSS, oriented to room, use of call bell, bed controls. Placed phone and call light within patient's reach. C/o of pain, will give prn meds as ordered. Will continue to monitor patient.

## 2020-01-20 NOTE — Progress Notes (Signed)
Report given to receiving RN on 6N. Patient alert and oriented, mae's well and pain under controlled,aware of transfer and all belongings with patient.

## 2020-01-20 NOTE — Progress Notes (Addendum)
PCA was discontinued per MD order, 80ml wasted in Stericycle and container was placed in sharp container. New orders for pain medication received. Patient tolerating pain medication well. Will continue to monitor.

## 2020-01-20 NOTE — Anesthesia Postprocedure Evaluation (Signed)
Anesthesia Post Note  Patient: Tammy Pittman  Procedure(s) Performed: HYSTERECTOMY ABDOMINAL WITH SALPINGECTOMY (Bilateral Abdomen)     Patient location during evaluation: PACU Anesthesia Type: General Level of consciousness: awake and alert Pain management: pain level controlled Vital Signs Assessment: post-procedure vital signs reviewed and stable Respiratory status: spontaneous breathing, nonlabored ventilation, respiratory function stable and patient connected to nasal cannula oxygen Cardiovascular status: blood pressure returned to baseline and stable Postop Assessment: no apparent nausea or vomiting Anesthetic complications: no    Last Vitals:  Vitals:   01/20/20 0722 01/20/20 0800  BP: 132/79   Pulse: 64   Resp: 18 19  Temp: (!) 36.4 C   SpO2: 100% 99%    Last Pain:  Vitals:   01/20/20 0800  TempSrc:   PainSc: 6                  Tiajuana Amass

## 2020-01-21 ENCOUNTER — Encounter (HOSPITAL_COMMUNITY): Payer: Self-pay

## 2020-01-21 ENCOUNTER — Encounter (HOSPITAL_COMMUNITY): Payer: Self-pay | Admitting: Obstetrics and Gynecology

## 2020-01-21 ENCOUNTER — Encounter: Payer: Self-pay | Admitting: *Deleted

## 2020-01-21 DIAGNOSIS — D259 Leiomyoma of uterus, unspecified: Principal | ICD-10-CM

## 2020-01-21 DIAGNOSIS — N939 Abnormal uterine and vaginal bleeding, unspecified: Secondary | ICD-10-CM

## 2020-01-21 MED ORDER — OXYCODONE-ACETAMINOPHEN 5-325 MG PO TABS: 1.0000 | ORAL_TABLET | ORAL | 0 refills | Status: DC | PRN

## 2020-01-21 MED ORDER — IBUPROFEN 600 MG PO TABS: 600.0000 mg | ORAL_TABLET | Freq: Four times a day (QID) | ORAL | 6 refills | Status: AC

## 2020-01-21 MED ORDER — DOCUSATE SODIUM 100 MG PO CAPS: 100.0000 mg | ORAL_CAPSULE | Freq: Two times a day (BID) | ORAL | 1 refills | Status: DC

## 2020-01-21 NOTE — Discharge Instructions (Signed)
Abdominal Hysterectomy, Care After This sheet gives you information about how to care for yourself after your procedure. Your health care provider may also give you more specific instructions. If you have problems or questions, contact your health care provider. What can I expect after the procedure? After your procedure, it is common to have:  Pain.  Fatigue.  Poor appetite.  Less interest in sex.  Vaginal bleeding and discharge. You may need to use a sanitary napkin after this procedure. Follow these instructions at home: Bathing  Do not take baths, swim, or use a hot tub until your health care provider approves. Ask your health care provider if you can take showers. You may only be allowed to take sponge baths for bathing.  Keep the bandage (dressing) dry until your health care provider says it can be removed. Incision care   Follow instructions from your health care provider about how to take care of your incision. Make sure you: ? Wash your hands with soap and water before you change your bandage (dressing). If soap and water are not available, use hand sanitizer. ? Change your dressing as told by your health care provider. ? Leave stitches (sutures), skin glue, or adhesive strips in place. These skin closures may need to stay in place for 2 weeks or longer. If adhesive strip edges start to loosen and curl up, you may trim the loose edges. Do not remove adhesive strips completely unless your health care provider tells you to do that.  Check your incision area every day for signs of infection. Check for: ? Redness, swelling, or pain. ? Fluid or blood. ? Warmth. ? Pus or a bad smell. Activity  Do gentle, daily exercises as told by your health care provider. You may be told to take short walks every day and go farther each time.  Do not lift anything that is heavier than 10 lb (4.5 kg), or the limit that your health care provider tells you, until he or she says that it is  safe.  Do not drive or use heavy machinery while taking prescription pain medicine.  Do not drive for 24 hours if you were given a medicine to help you relax (sedative).  Follow your health care provider's instructions about exercise, driving, and general activities. Ask your health care provider what activities are safe for you. Lifestyle  Do not douche, use tampons, or have sex for at least 6 weeks or as told by your health care provider.  Do not drink alcohol until your health care provider approves.  Drink enough fluid to keep your urine clear or pale yellow.  Try to have someone at home with you for the first 1-2 weeks to help.  Do not use any products that contain nicotine or tobacco, such as cigarettes and e-cigarettes. These can delay healing. If you need help quitting, ask your health care provider. General instructions  Take over-the-counter and prescription medicines only as told by your health care provider.  Do not take aspirin or ibuprofen. These medicines can cause bleeding.  To prevent or treat constipation while you are taking prescription pain medicine, your health care provider may recommend that you: ? Drink enough fluid to keep your urine clear or pale yellow. ? Take over-the-counter or prescription medicines. ? Eat foods that are high in fiber, such as fresh fruits and vegetables, whole grains, and beans. ? Limit foods that are high in fat and processed sugars, such as fried and sweet foods.  Keep all   follow-up visits as told by your health care provider. This is important. Contact a health care provider if:  You have chills or fever.  You have redness, swelling, or pain around your incision.  You have fluid or blood coming from your incision.  Your incision feels warm to the touch.  You have pus or a bad smell coming from your incision.  Your incision breaks open.  You feel dizzy or light-headed.  You have pain or bleeding when you urinate.  You  have persistent diarrhea.  You have persistent nausea and vomiting.  You have abnormal vaginal discharge.  You have a rash.  You have any type of abnormal reaction or you develop an allergy to your medicine.  Your pain medicine does not help. Get help right away if:  You have a fever and your symptoms suddenly get worse.  You have severe abdominal pain.  You have shortness of breath.  You faint.  You have pain, swelling, or redness in your leg.  You have heavy vaginal bleeding with blood clots. Summary  After your procedure, it is common to have pain, fatigue and vaginal discharge.  Do not take baths, swim, or use a hot tub until your health care provider approves. Ask your health care provider if you can take showers. You may only be allowed to take sponge baths for bathing.  Follow your health care provider's instructions about exercise, driving, and general activities. Ask your health care provider what activities are safe for you.  Do not lift anything that is heavier than 10 lb (4.5 kg), or the limit that your health care provider tells you, until he or she says that it is safe.  Try to have someone at home with you for the first 1-2 weeks to help. This information is not intended to replace advice given to you by your health care provider. Make sure you discuss any questions you have with your health care provider. Document Revised: 12/16/2018 Document Reviewed: 10/31/2016 [employer] Patient Education  2020 [employer]  

## 2020-01-21 NOTE — Progress Notes (Signed)
Discharged patient to home, AVS given and explained with husband at bedside. Questions answered to patient's satisfaction. Belongings returned accordingly.

## 2020-01-21 NOTE — Discharge Summary (Signed)
OB Discharge Summary     Patient Name: Tammy Pittman DOB: 1976/04/29 MRN: [id number]  Date of admission: 01/19/2020  Date of discharge: 01/21/2020  Admitting diagnosis: S/P hysterectomy [Z90.710] Intrauterine pregnancy: Unknown     Secondary diagnosis:  Active Problems:   S/P hysterectomy      Discharge diagnosis: s/p TAH/BS due to AUB and fibroid uterus                                   Hospital course:  Patient admitted for scheduled Hysterectomy for the treatment of AUB s/p endometrial ablation. Patient had an uncomplicated surgery where a 440 gm fibroid uterus was removed. The patient had an uncomplicated hospital course. She ambulated, voided, tolerated a regular diet and passed flatus. Her pain was well controlled on oral pain medication. Patient was found stable for discharge on POD#2. Discharge instructions and precautions were reviewed with the patient.  Physical exam  Vitals:   01/20/20 1303 01/20/20 2026 01/21/20 0506 01/21/20 0700  BP: 109/66 117/60 100/62 115/81  Pulse: 66 70 75 83  Resp: 16 18 16 16   Temp: 97.9 F (36.6 C) 97.8 F (36.6 C) 98.2 F (36.8 C) 98 F (36.7 C)  TempSrc: Oral Oral Oral Oral  SpO2: 100% 100% 100% 98%  Weight:      Height:       General: alert, cooperative and no distress Abdomen: Soft, non tender, non distended, + BS Incision: Dressing is clean, dry, and intact DVT Evaluation: No evidence of DVT seen on physical exam. Labs: Lab Results  Component Value Date   WBC 12.4 (H) 01/20/2020   HGB 12.1 01/20/2020   HCT 35.1 (L) 01/20/2020   MCV 92.1 01/20/2020   PLT 364 01/20/2020   CMP Latest Ref Rng & Units 01/19/2020  Glucose 65 - 99 mg/dL -  BUN 7 - 25 mg/dL -  Creatinine 0.44 - 1.00 mg/dL 0.87  Sodium 135 - 146 mmol/L -  Potassium 3.5 - 5.3 mmol/L -  Chloride 98 - 110 mmol/L -  CO2 20 - 32 mmol/L -  Calcium 8.6 - 10.2 mg/dL -  Total Protein 6.1 - 8.1 g/dL -  Total Bilirubin 0.2 - 1.2 mg/dL -  Alkaline Phos 38 - 126  U/L -  AST 10 - 30 U/L -  ALT 6 - 29 U/L -    Discharge instruction: per After Visit Summary.  After visit meds:  Allergies as of 01/21/2020      Reactions   Penicillins Hives   Did it involve swelling of the face/tongue/throat, SOB, or low BP? Unknown Did it involve sudden or severe rash/hives, skin peeling, or any reaction on the inside of your mouth or nose? Yes Did you need to seek medical attention at a hospital or doctor's office? Unknown When did it last happen?CHILDHOOD REACTION. If all above answers are "NO", may proceed with cephalosporin use.      Medication List    STOP taking these medications   megestrol 40 MG tablet Commonly known as: MEGACE   norethindrone 5 MG tablet Commonly known as: AYGESTIN     TAKE these medications   docusate sodium 100 MG capsule Commonly known as: COLACE Take 1 capsule (100 mg total) by mouth 2 (two) times daily.   ibuprofen 600 MG tablet Commonly known as: ADVIL Take 1 tablet (600 mg total) by mouth every 6 (six) hours.   oxyCODONE-acetaminophen 5-325  MG tablet Commonly known as: PERCOCET/ROXICET Take 1 tablet by mouth every 4 (four) hours as needed for moderate pain or severe pain.       Diet: routine diet  Activity: Advance as tolerated. Pelvic rest for 6 weeks.   Outpatient follow up:4 weeks Follow up Appt: Future Appointments  Date Time Provider [place]  05/10/2020  8:40 AM [facility] DIAG TOMO 1 [facility]  05/10/2020  8:50 AM [facility] [place] 1 [facility]     01/21/2020 Mora Bellman, MD

## 2020-01-21 NOTE — Progress Notes (Signed)
RN notified of the patients pain and verbalized addressing the issue.

## 2020-02-25 ENCOUNTER — Ambulatory Visit (INDEPENDENT_AMBULATORY_CARE_PROVIDER_SITE_OTHER): Payer: Self-pay | Admitting: Obstetrics and Gynecology

## 2020-02-25 ENCOUNTER — Other Ambulatory Visit: Payer: Self-pay

## 2020-02-25 ENCOUNTER — Encounter: Payer: Self-pay | Admitting: Obstetrics and Gynecology

## 2020-02-25 ENCOUNTER — Other Ambulatory Visit (HOSPITAL_COMMUNITY)
Admission: RE | Admit: 2020-02-25 | Discharge: 2020-02-25 | Disposition: A | Discharge status: Home / Self Care | Payer: No Typology Code available for payment source | Source: Ambulatory Visit | Attending: Obstetrics and Gynecology | Admitting: Obstetrics and Gynecology

## 2020-02-25 VITALS — BP 148/89 | HR 96 | Wt 172.5 lb

## 2020-02-25 DIAGNOSIS — N760 Acute vaginitis: Secondary | ICD-10-CM | POA: Diagnosis present

## 2020-02-25 DIAGNOSIS — Z09 Encounter for follow-up examination after completed treatment for conditions other than malignant neoplasm: Secondary | ICD-10-CM

## 2020-02-25 DIAGNOSIS — Z9889 Other specified postprocedural states: Secondary | ICD-10-CM

## 2020-02-25 MED ORDER — NYSTATIN-TRIAMCINOLONE 100000-0.1 UNIT/GM-% EX OINT: 1.0000 "application " | TOPICAL_OINTMENT | Freq: Two times a day (BID) | CUTANEOUS | 0 refills | Status: DC

## 2020-02-25 NOTE — Progress Notes (Signed)
Post Op visit - s/p TAH w/ Salpingectomy 01/19/20 Possible yeast infection c/o inner and outer vaginal irritation, denies abnormal vaginal discharge.

## 2020-02-25 NOTE — Progress Notes (Signed)
44 yo here for post op check s/p TAH/BS on 01/19/20. Patient reports feeling well since discharge from the hospital. She denies any fever, chills, or drainage from her incision. She states that she has returned to her regular activities and is ready to return to work. She reports some vaginal irritation and is concerned of a possible yeast infection  Past Medical History:  Diagnosis Date  . Constipation   . Sickle cell trait ([place])   . Ulcer of the stomach and intestine    Past Surgical History:  Procedure Laterality Date  . ABDOMINAL HYSTERECTOMY  01/19/2020  . CESAREAN SECTION    . HYSTERECTOMY ABDOMINAL WITH SALPINGECTOMY Bilateral 01/19/2020   Procedure: HYSTERECTOMY ABDOMINAL WITH SALPINGECTOMY;  Surgeon: Mora Bellman, MD;  Location: [place];  Service: Gynecology;  Laterality: Bilateral;  . NOVASURE ABLATION    . TUBAL LIGATION     Family History  Problem Relation Age of Onset  . Heart attack Father    Social History   Tobacco Use  . Smoking status: Current Some Day Smoker    Types: Cigarettes  . Smokeless tobacco: Never Used  . Tobacco comment: not a consistent smoker. Picks up habit randomly.   Substance Use Topics  . Alcohol use: Yes    Alcohol/week: 3.0 standard drinks    Types: 3 Glasses of wine per week  . Drug use: No   ROS See pertinent in HPI  Blood pressure (!) 148/89, pulse 96, weight 172 lb 8 oz (78.2 kg), last menstrual period 01/16/2020. GENERAL: Well-developed, well-nourished female in no acute distress.  ABDOMEN: Soft, nontender, nondistended. No organomegaly. Incision: healed without erythema, induration or drainage PELVIC: Normal external female genitalia. Vagina is pink and rugated.  Vaginal vault intact. Normal discharge. No adnexal mass or tenderness. EXTREMITIES: No cyanosis, clubbing, or edema, 2+ distal pulses.  A/P 44 yo here for post op check s/p TAH/BS - Patient is medically cleared to resume all activities of daily living - Vaginal swab  collected - Patient will be contacted with abnormal results - RTC prn

## 2020-02-26 LAB — CERVICOVAGINAL ANCILLARY ONLY
Bacterial Vaginitis (gardnerella): NEGATIVE
Candida Glabrata: NEGATIVE
Candida Vaginitis: NEGATIVE
Comment: NEGATIVE
Comment: NEGATIVE
Comment: NEGATIVE

## 2020-03-12 IMAGING — US US BREAST*R* LIMITED INC AXILLA
1 series · 5 of 5 positions shown · non-contrast
Comparison: None

CLINICAL DATA: Patient presents for palpable abnormality within the
anterior left breast. Patient also reports a possible palpable
abnormality, physician detected, within the inferior left breast.

EXAM:
DIGITAL DIAGNOSTIC BILATERAL MAMMOGRAM WITH CAD AND TOMO
ULTRASOUND BILATERAL BREAST

[Series 1: us breast*right* limited inc axilla · 0.06mm/px · 5 of 5 slices shown]
[im 1/5]
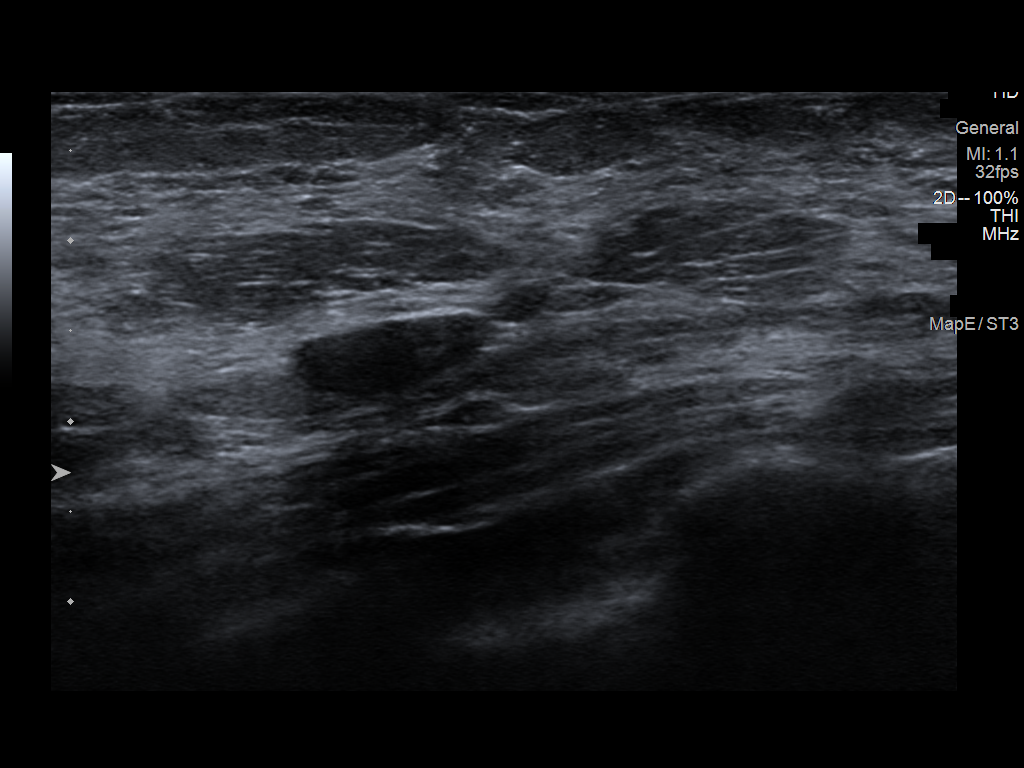
[im 2/5]
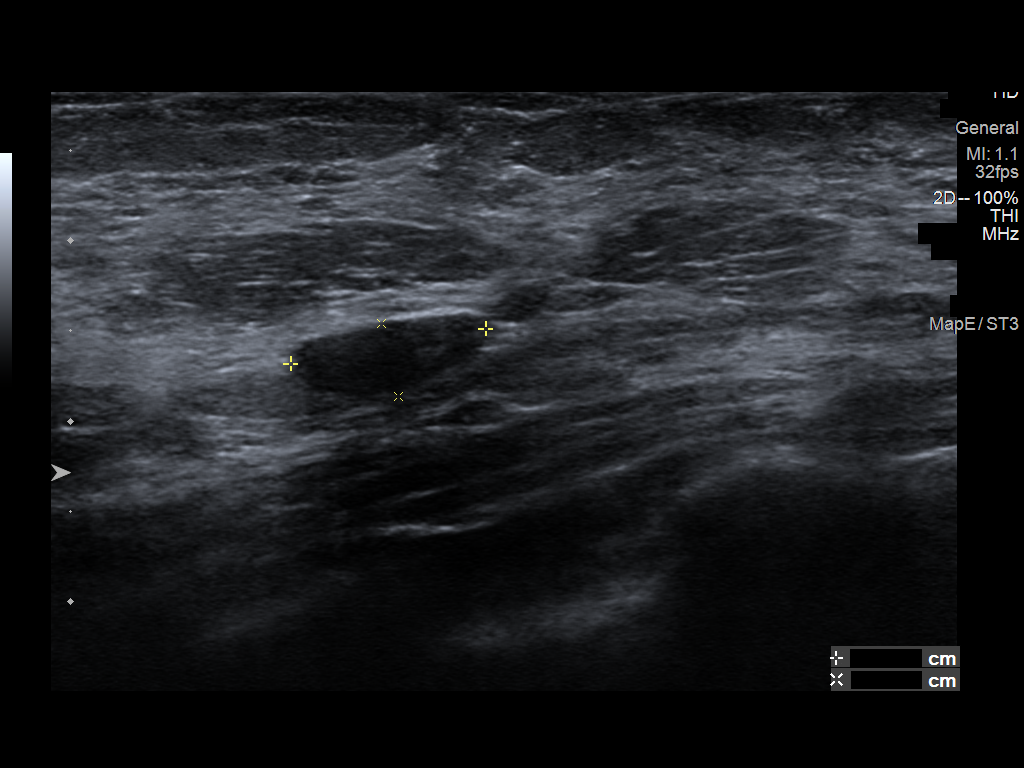
[im 3/5]
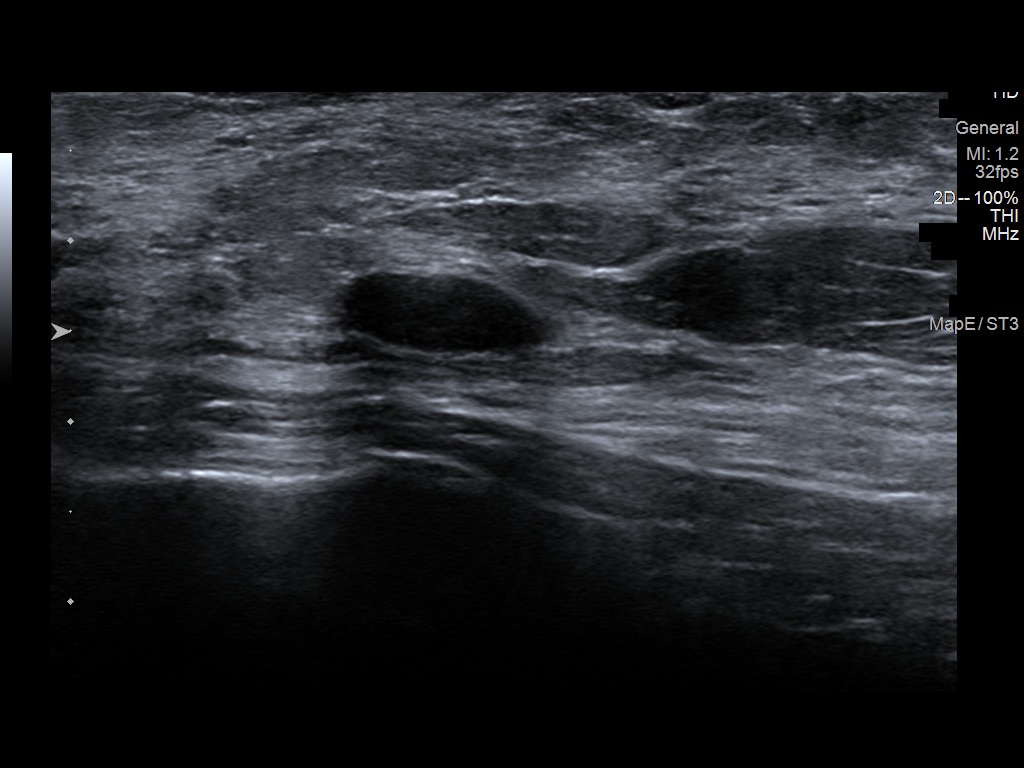
[im 4/5]
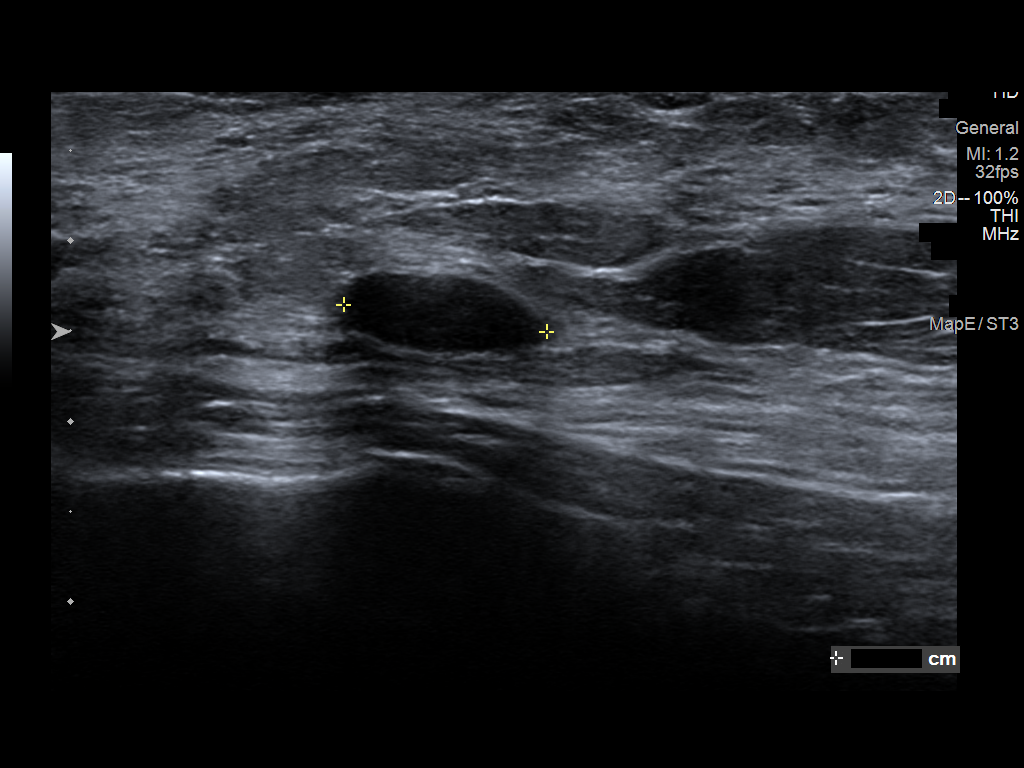
[im 5/5]
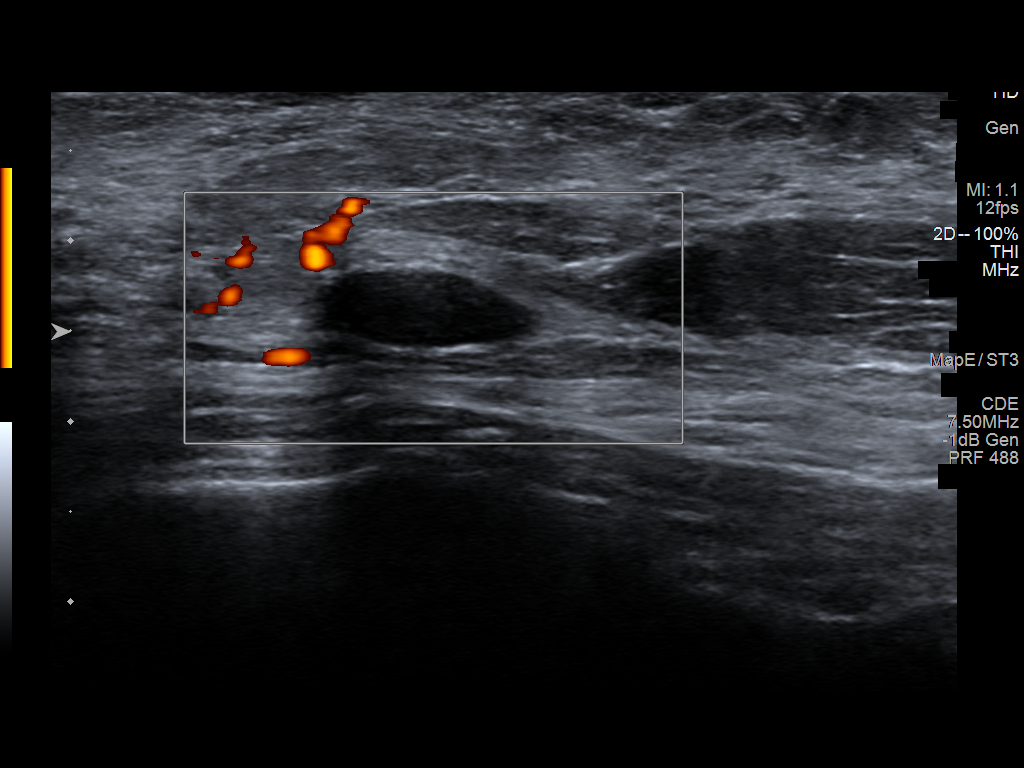

[5 of 5 positions shown; findings below may reference images not displayed]

ACR Breast Density Category b: There are scattered areas of
fibroglandular density.
FINDINGS: Underlying the skin within the anterior left breast is a small oval
circumscribed mass. No additional masses identified within the left
breast.

Within the outer right breast there is a small oval circumscribed
mass. No additional concerning masses within the outer right breast.

Mammographic images were processed with CAD.

Targeted ultrasound is performed, showing a 1.1 x 0.4 x 1.1 cm oval
circumscribed hypoechoic mass right breast 9:30 o'clock 7 cm from
nipple.

Within the left breast 3 o'clock position retroareolar location of
the site of palpable abnormality there is a 0.9 x 0.3 x 1.0 cm cyst.

No suspicious abnormality within the left breast 4-6 o'clock
position at the additional palpable site.
IMPRESSION: 1. Probably benign right breast mass 9:30 o'clock favored to
represent a fibroadenoma.
2. Benign left breast cyst corresponding with palpable abnormality.

RECOMMENDATION:
Right breast diagnostic mammogram and ultrasound in 6 months to
ensure stability of probably benign right breast mass.

I have discussed the findings and recommendations with the patient.
If applicable, a reminder letter will be sent to the patient
regarding the next appointment.

BI-RADS CATEGORY  3: Probably benign.

## 2020-03-12 IMAGING — US US BREAST*L* LIMITED INC AXILLA
1 series · 8 of 8 positions shown · non-contrast
Comparison: None

CLINICAL DATA: Patient presents for palpable abnormality within the
anterior left breast. Patient also reports a possible palpable
abnormality, physician detected, within the inferior left breast.

EXAM:
DIGITAL DIAGNOSTIC BILATERAL MAMMOGRAM WITH CAD AND TOMO
ULTRASOUND BILATERAL BREAST

[Series 1: us breast*left* limited inc axilla · 0.06mm/px · 8 of 8 slices shown]
[im 1/8]
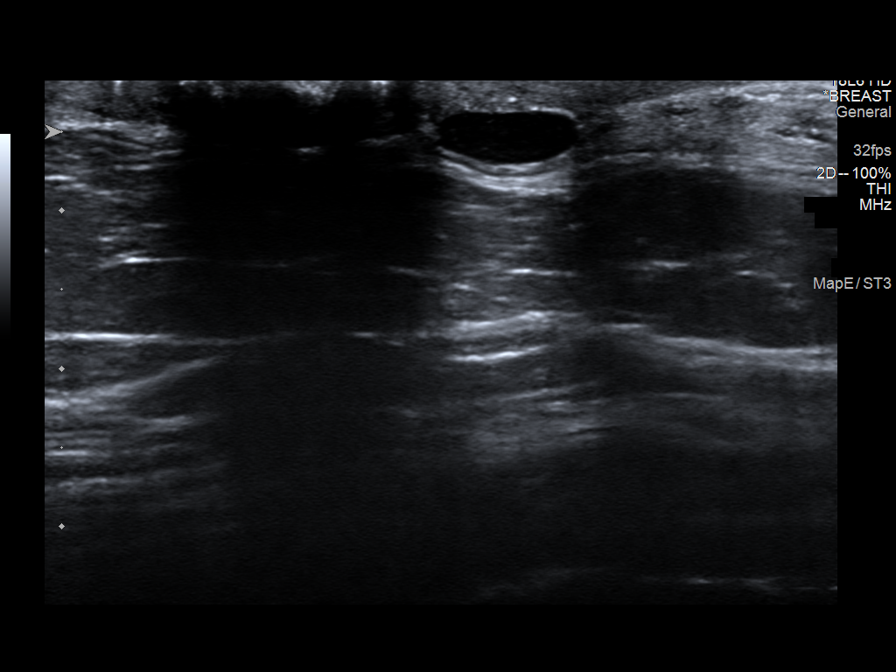
[im 2/8]
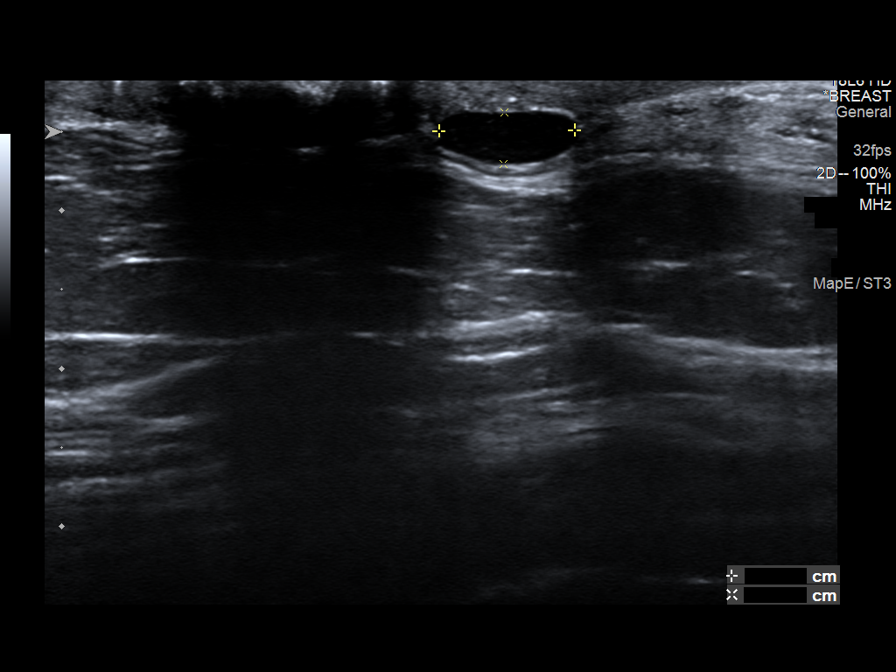
[im 3/8]
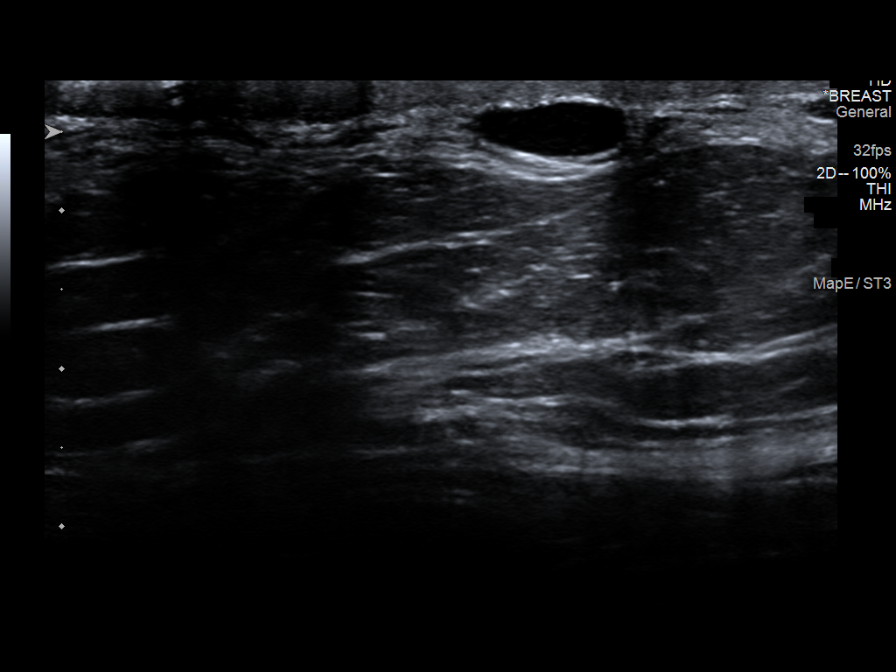
[im 4/8]
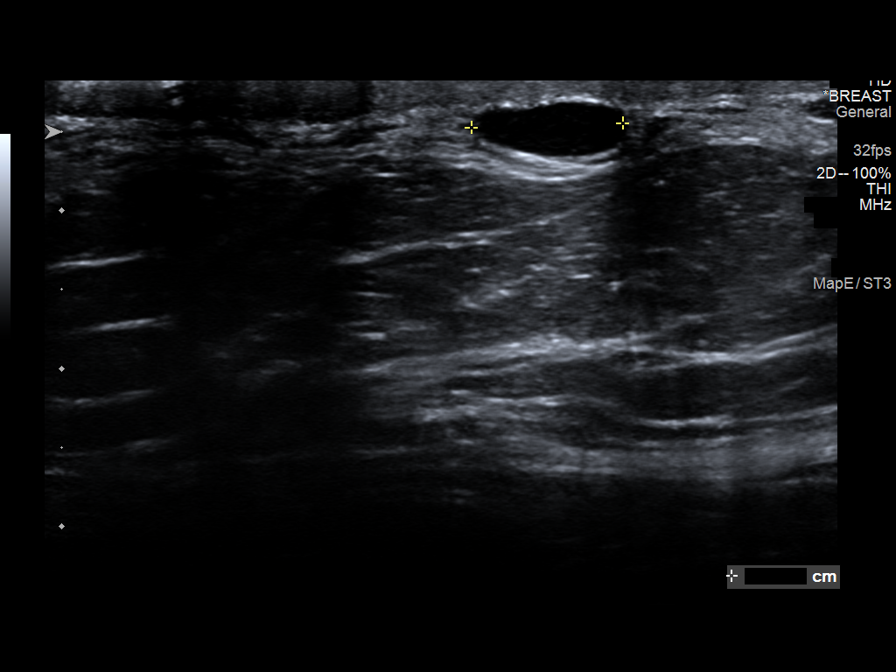
[im 5/8]
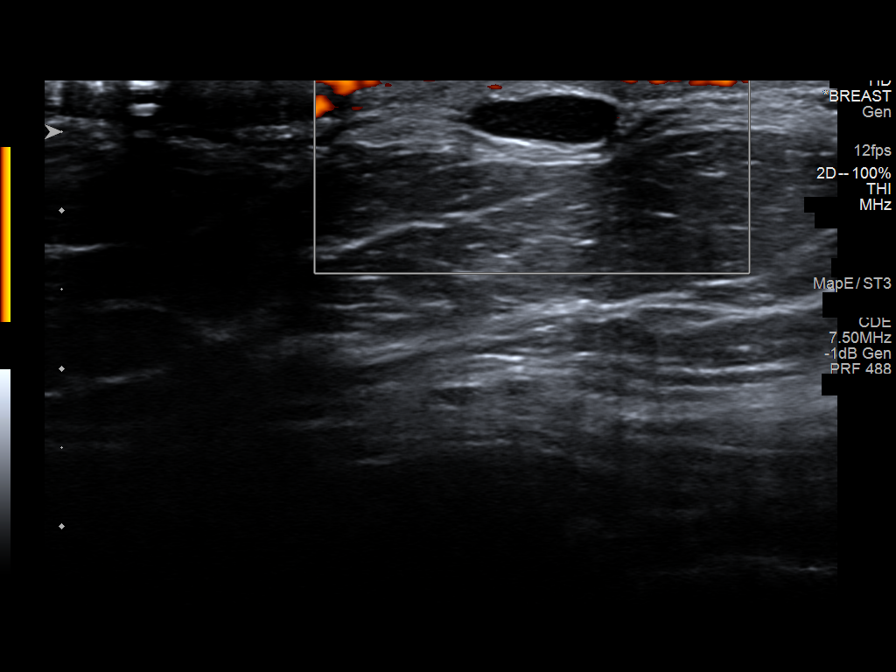
[im 6/8]
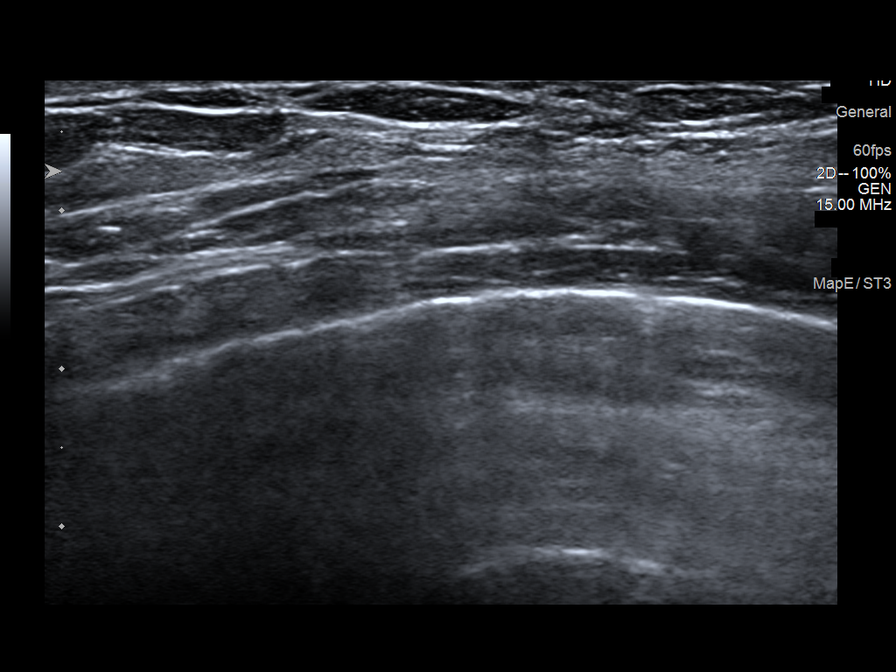
[im 7/8]
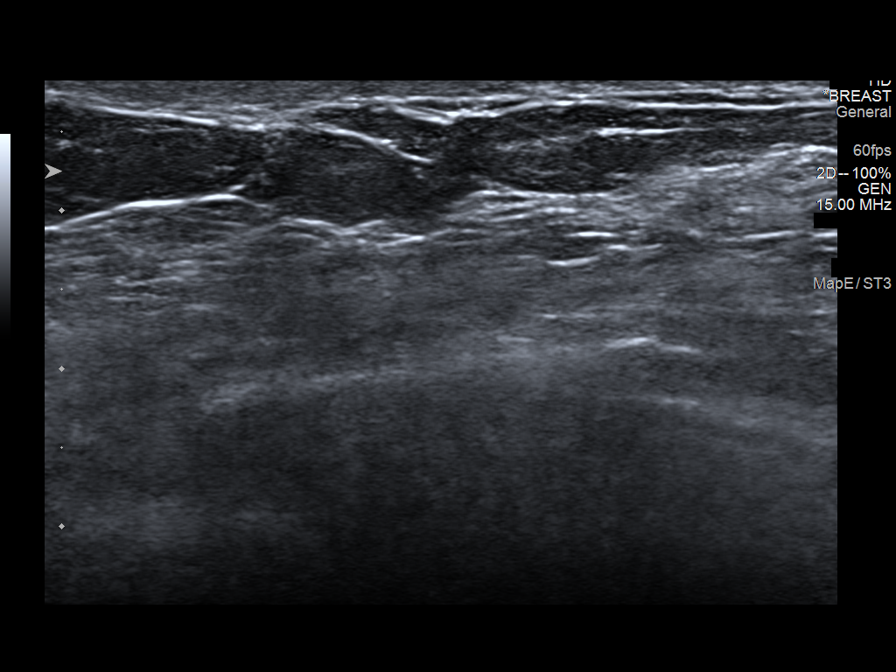
[im 8/8]
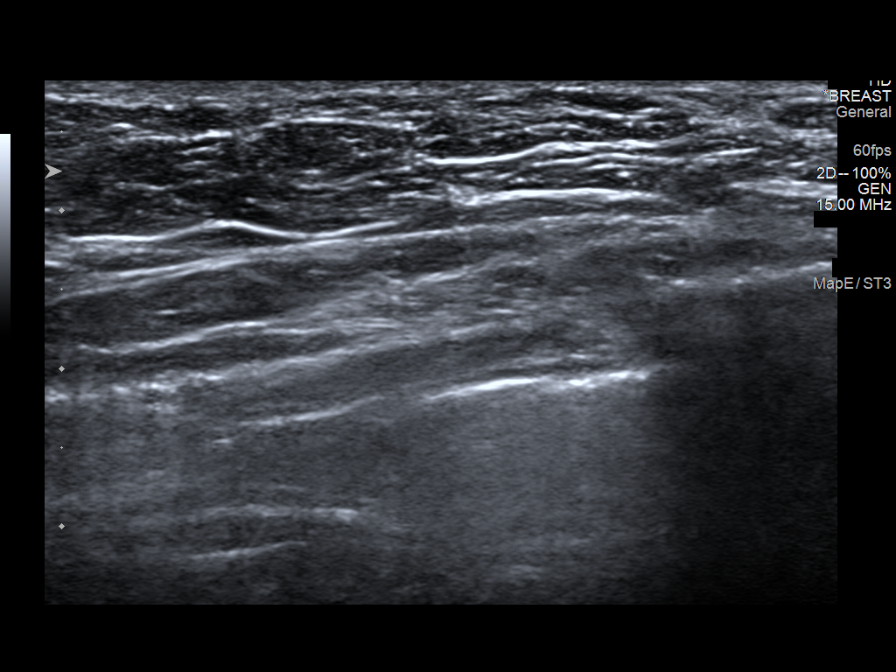

[8 of 8 positions shown; findings below may reference images not displayed]

ACR Breast Density Category b: There are scattered areas of
fibroglandular density.
FINDINGS: Underlying the skin within the anterior left breast is a small oval
circumscribed mass. No additional masses identified within the left
breast.

Within the outer right breast there is a small oval circumscribed
mass. No additional concerning masses within the outer right breast.

Mammographic images were processed with CAD.

Targeted ultrasound is performed, showing a 1.1 x 0.4 x 1.1 cm oval
circumscribed hypoechoic mass right breast 9:30 o'clock 7 cm from
nipple.

Within the left breast 3 o'clock position retroareolar location of
the site of palpable abnormality there is a 0.9 x 0.3 x 1.0 cm cyst.

No suspicious abnormality within the left breast 4-6 o'clock
position at the additional palpable site.
IMPRESSION: 1. Probably benign right breast mass 9:30 o'clock favored to
represent a fibroadenoma.
2. Benign left breast cyst corresponding with palpable abnormality.

RECOMMENDATION:
Right breast diagnostic mammogram and ultrasound in 6 months to
ensure stability of probably benign right breast mass.

I have discussed the findings and recommendations with the patient.
If applicable, a reminder letter will be sent to the patient
regarding the next appointment.

BI-RADS CATEGORY  3: Probably benign.

## 2020-03-12 IMAGING — MG DIGITAL DIAGNOSTIC BILAT W/ TOMO W/ CAD
6 of 10 series · 6 of 30 positions shown · non-contrast
Comparison: None

CLINICAL DATA: Patient presents for palpable abnormality within the
anterior left breast. Patient also reports a possible palpable
abnormality, physician detected, within the inferior left breast.

EXAM:
DIGITAL DIAGNOSTIC BILATERAL MAMMOGRAM WITH CAD AND TOMO
ULTRASOUND BILATERAL BREAST

[R MLO synth-2D]
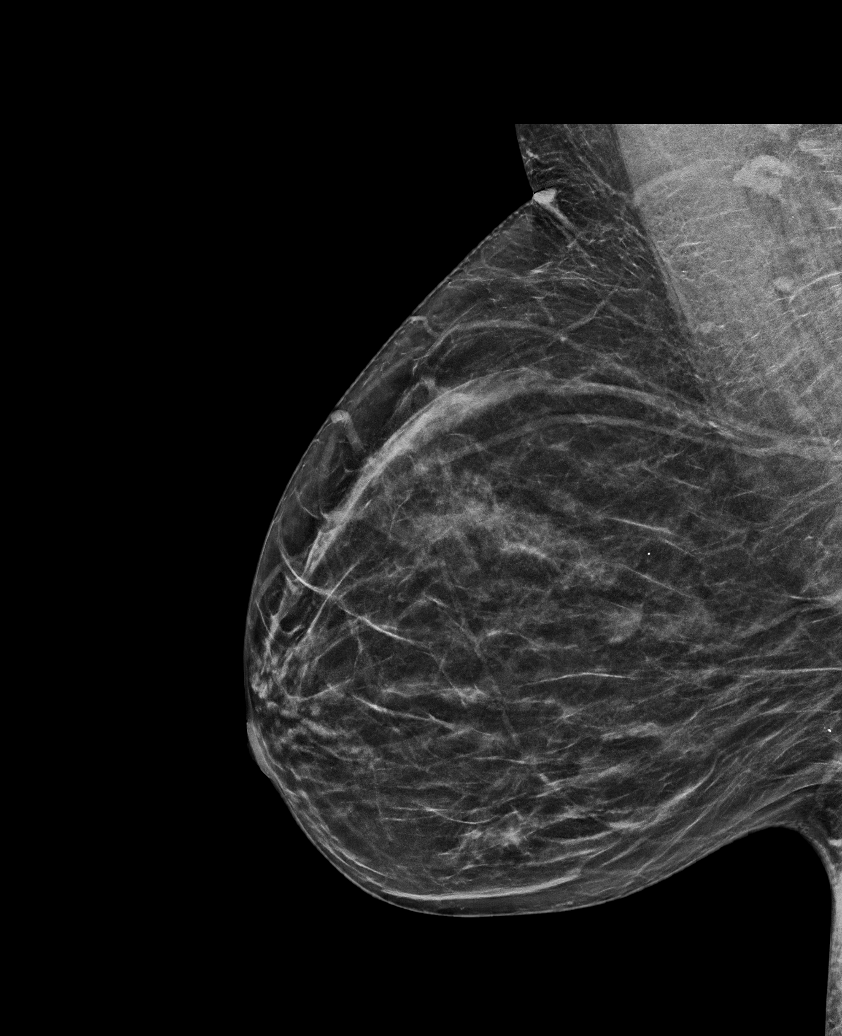

[L MLO synth-2D]
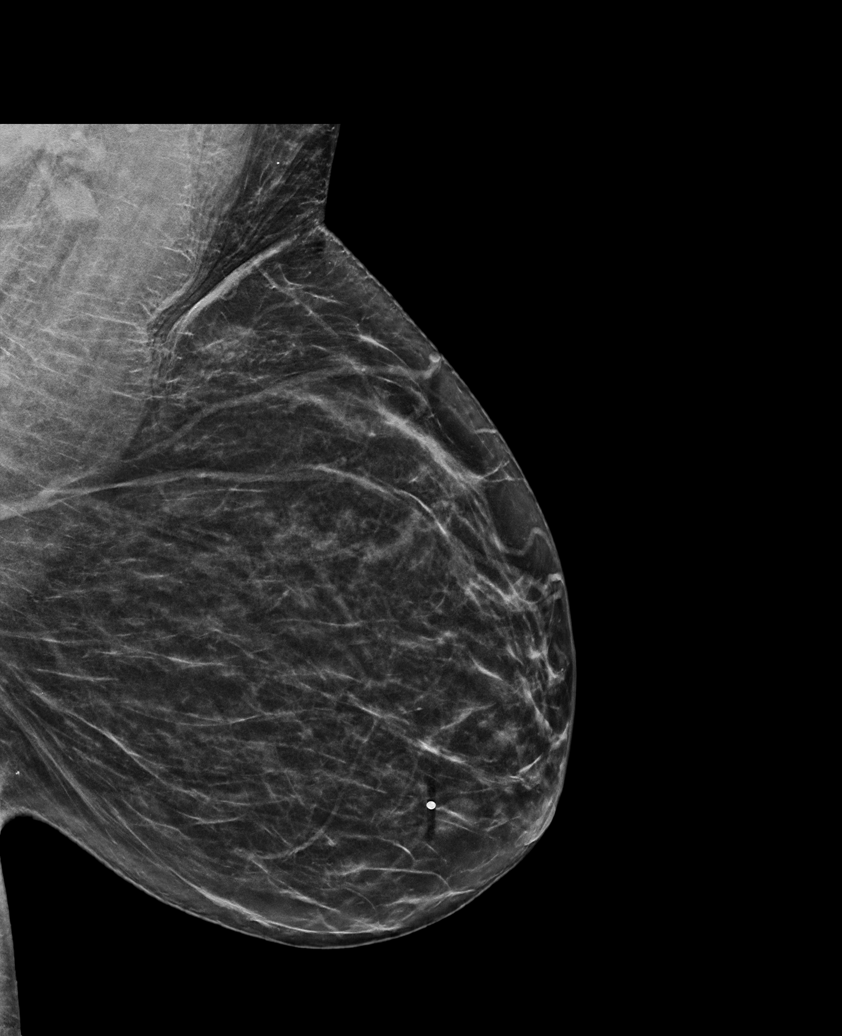

[L CC synth-2D (1 of 2)]
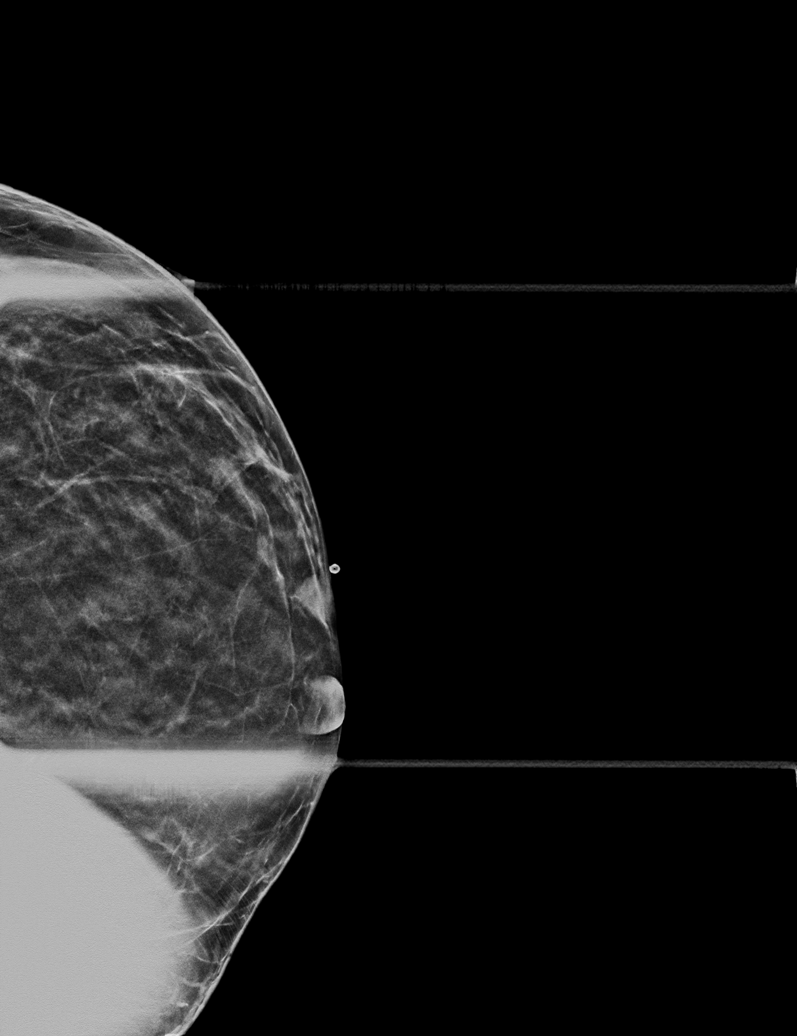

[R CC synth-2D]
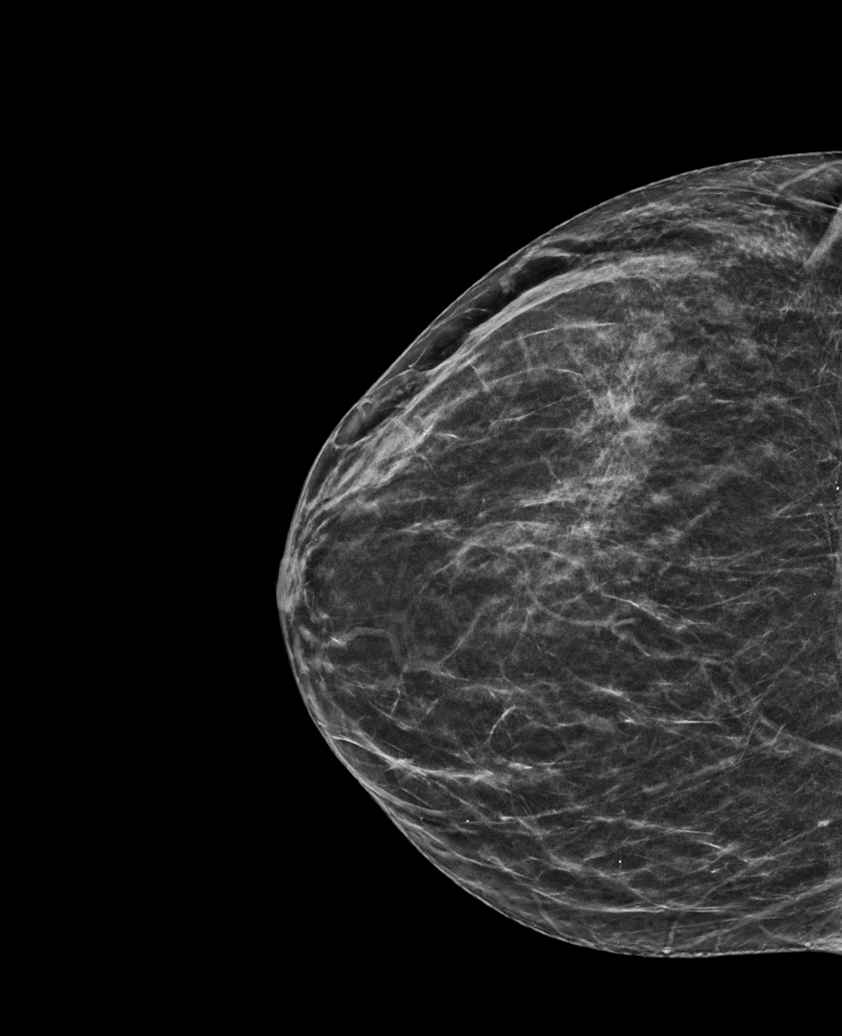

[L CC synth-2D (2 of 2)]
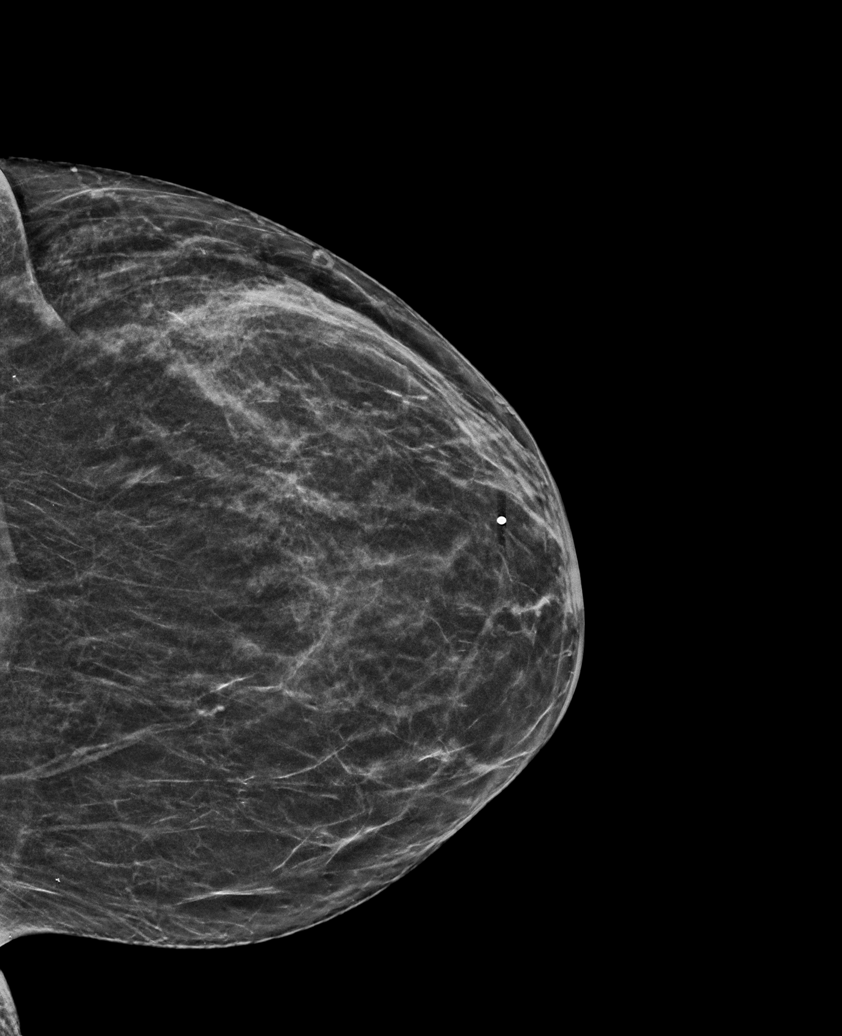

[L CC tomo · tomo slice 19/37.0]
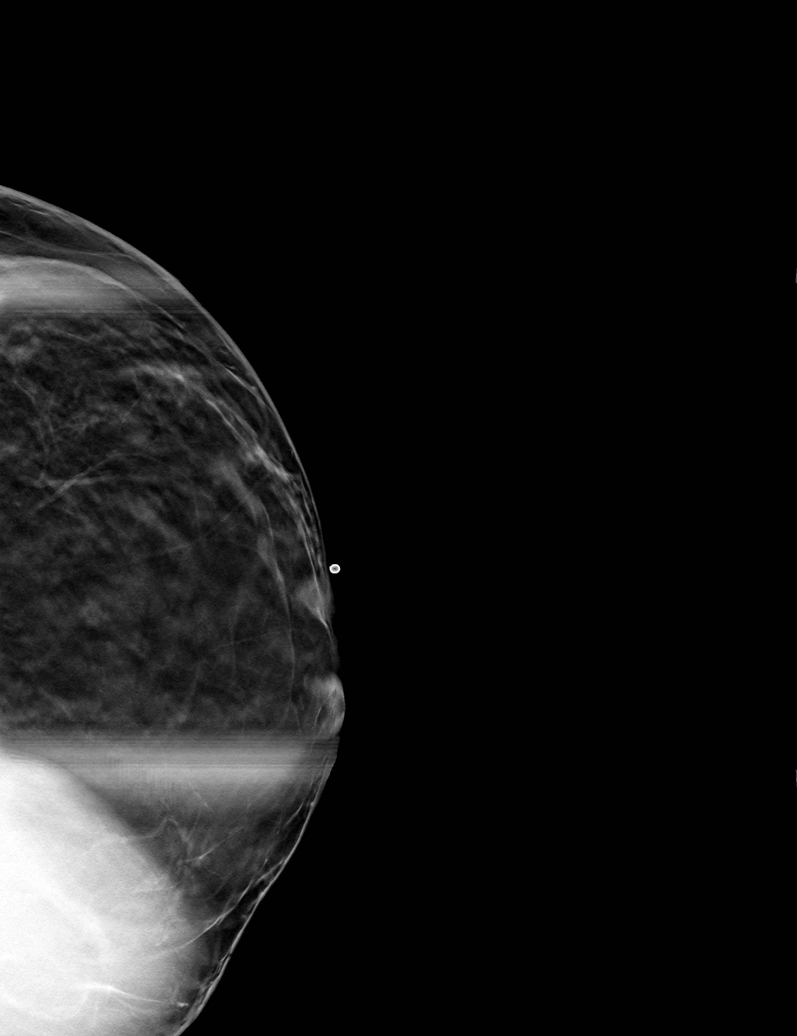

[6 of 30 positions shown; findings below may reference images not displayed]

ACR Breast Density Category b: There are scattered areas of
fibroglandular density.
FINDINGS: Underlying the skin within the anterior left breast is a small oval
circumscribed mass. No additional masses identified within the left
breast.

Within the outer right breast there is a small oval circumscribed
mass. No additional concerning masses within the outer right breast.

Mammographic images were processed with CAD.

Targeted ultrasound is performed, showing a 1.1 x 0.4 x 1.1 cm oval
circumscribed hypoechoic mass right breast 9:30 o'clock 7 cm from
nipple.

Within the left breast 3 o'clock position retroareolar location of
the site of palpable abnormality there is a 0.9 x 0.3 x 1.0 cm cyst.

No suspicious abnormality within the left breast 4-6 o'clock
position at the additional palpable site.
IMPRESSION: 1. Probably benign right breast mass 9:30 o'clock favored to
represent a fibroadenoma.
2. Benign left breast cyst corresponding with palpable abnormality.

RECOMMENDATION:
Right breast diagnostic mammogram and ultrasound in 6 months to
ensure stability of probably benign right breast mass.

I have discussed the findings and recommendations with the patient.
If applicable, a reminder letter will be sent to the patient
regarding the next appointment.

BI-RADS CATEGORY  3: Probably benign.

## 2020-05-10 ENCOUNTER — Other Ambulatory Visit: Payer: PRIVATE HEALTH INSURANCE

## 2024-07-22 ENCOUNTER — Encounter: Payer: Self-pay | Admitting: Obstetrics and Gynecology

## 2024-08-08 NOTE — Progress Notes (Signed)
 Patient rescheduled appointment.  Camie Rote, MSN, CNM, RNC-OB Certified Nurse Midwife, [facility] 08/15/2024 2:40 PM

## 2024-08-14 ENCOUNTER — Ambulatory Visit: Payer: Self-pay | Admitting: Certified Nurse Midwife

## 2024-08-14 DIAGNOSIS — Z01419 Encounter for gynecological examination (general) (routine) without abnormal findings: Secondary | ICD-10-CM

## 2024-09-11 ENCOUNTER — Ambulatory Visit: Payer: Self-pay | Admitting: Certified Nurse Midwife

## 2024-09-11 ENCOUNTER — Encounter: Payer: Self-pay | Admitting: Certified Nurse Midwife

## 2024-09-11 VITALS — BP 116/78 | HR 80 | Wt 166.1 lb

## 2024-09-11 DIAGNOSIS — R1030 Lower abdominal pain, unspecified: Secondary | ICD-10-CM

## 2024-09-11 DIAGNOSIS — Z01419 Encounter for gynecological examination (general) (routine) without abnormal findings: Secondary | ICD-10-CM

## 2024-09-11 NOTE — Progress Notes (Signed)
   Subjective:     Tammy Pittman is a 48 y.o. female here at [facility] for a routine exam.  Current complaints: She previously presented to ED for abdominal pain and an US  revealed ovarian cysts. She was told to follow up with her GYN provider. She had undergone a hysterectomy and was unware that she still had ovaries.  Personal and family health history reviewed: yes.  Do you have a primary care provider? Dr. Rollene Do you feel safe at home? accompanied by spouse   Flowsheet Row Office Visit from 09/11/2024 in [facility]  PHQ-2 Total Score 0    Health Maintenance Due  Topic Date Due   Hepatitis C Screening  Never done   DTaP/Tdap/Td (1 - Tdap) Never done   Pneumococcal Vaccine (1 of 2 - PCV) Never done   Hepatitis B Vaccines 19-59 Average Risk (1 of 3 - 19+ 3-dose series) Never done   HPV VACCINES (1 - 3-dose SCDM series) Never done   Mammogram  11/08/2021   Colonoscopy  Never done   Influenza Vaccine  Never done   COVID-19 Vaccine (1 - 2025-26 season) Never done     Risk factors for chronic health problems: Smoking: Alchohol/how much: Pt BMI: Body mass index is 27.64 kg/m.   Gynecologic History Patient's last menstrual period was 01/16/2020. Contraception: hysterectomy Last mammogram: 2021. Results were: BI RADS 3  Obstetric History OB History  Gravida Para Term Preterm AB Living  2 2 1 1  2   SAB IAB Ectopic Multiple Live Births      2    # Outcome Date GA Lbr Len/2nd Weight Sex Type Anes PTL Lv  2 Preterm 05/04/99 [redacted]w[redacted]d   F CS-LTranv   LIV  1 Term 01/16/94    M Vag-Spont   LIV     The following portions of the patient's history were reviewed and updated as appropriate: allergies, current medications, past family history, past medical history, past social history, past surgical history, and problem list.  Review of Systems Pertinent items are noted in HPI.    Objective:   Today's Vitals   09/11/24  1029  BP: 116/78  Pulse: 80  Weight: 166 lb 1.3 oz (75.3 kg)   Body mass index is 27.64 kg/m.  VS reviewed, nursing note reviewed,  Constitutional: well developed, well nourished, no distress HEENT: normocephalic, thyroid without enlargement or mass HEART: RRR, no murmurs rubs/gallops RESP: clear and equal to auscultation bilaterally in all lobes  Breast Exam:  Ordered mammogram, exam performed: right breast normal without mass, skin or nipple changes or axillary nodes, left breast with small mobile, smooth nodule at 3 o'clock, visible just under areola, no other nipple changes or axillary nodes. Fibrocystic breasts noted.  Abdomen: soft, bowel sounds present x4 Neuro: alert and oriented x 3 Skin: warm, dry Psych: affect normal Pelvic exam: Deferred     Assessment/Plan:   1. Well woman exam (Primary) - MM 3D SCREENING MAMMOGRAM BILATERAL BREAST; Future   2. Lower abdominal pain - Denies current pain, discussed recommendation of good follow up to track changes. Patient agreeable to suggestion. - US  PELVIC COMPLETE WITH TRANSVAGINAL; Future    Return in about 1 year (around 09/11/2025) for Annual.   Tammy Pittman, CNM 7:47 PM

## 2024-09-16 ENCOUNTER — Ambulatory Visit: Payer: Self-pay

## 2024-09-16 DIAGNOSIS — R1030 Lower abdominal pain, unspecified: Secondary | ICD-10-CM

## 2024-09-17 ENCOUNTER — Telehealth (HOSPITAL_BASED_OUTPATIENT_CLINIC_OR_DEPARTMENT_OTHER): Payer: Self-pay

## 2024-09-18 ENCOUNTER — Ambulatory Visit: Payer: Self-pay | Admitting: Certified Nurse Midwife

## 2024-09-18 NOTE — Progress Notes (Signed)
 Images and report compared with report from CT 07/12/24 from [facility] available in Care Everywhere.  Reassuring findings communicated to patient on MyChart.  Camie Rote, MSN, CNM, RNC-OB Certified Nurse Midwife, [facility] 09/18/2024 1:11 PM

## 2024-09-24 ENCOUNTER — Ambulatory Visit (HOSPITAL_BASED_OUTPATIENT_CLINIC_OR_DEPARTMENT_OTHER): Payer: Self-pay

## 2024-09-29 ENCOUNTER — Other Ambulatory Visit: Payer: Self-pay | Admitting: Certified Nurse Midwife

## 2024-09-29 DIAGNOSIS — Z9289 Personal history of other medical treatment: Secondary | ICD-10-CM

## 2024-10-01 ENCOUNTER — Inpatient Hospital Stay (HOSPITAL_BASED_OUTPATIENT_CLINIC_OR_DEPARTMENT_OTHER): Admission: RE | Admit: 2024-10-01 | Payer: Self-pay | Source: Ambulatory Visit

## 2024-10-02 ENCOUNTER — Telehealth: Payer: Self-pay

## 2024-10-02 NOTE — Telephone Encounter (Signed)
 Pt called and wanted to know what her pelvic US  showed, so I read to her what Lauraine Rote sent to her on though Mychart.  I also told her know that images were reassuring and Lauraine thought that the images looked better than previous images. Pt was very appreciative. Pt also said that she would call back to [facility] and see if they would let her schedule her Diagnostic Mammo.

## 2024-10-06 ENCOUNTER — Other Ambulatory Visit: Payer: Self-pay | Admitting: Certified Nurse Midwife

## 2024-10-06 DIAGNOSIS — N6310 Unspecified lump in the right breast, unspecified quadrant: Secondary | ICD-10-CM

## 2024-10-14 ENCOUNTER — Other Ambulatory Visit: Payer: Self-pay | Admitting: Certified Nurse Midwife

## 2024-10-14 ENCOUNTER — Ambulatory Visit
Admission: RE | Admit: 2024-10-14 | Discharge: 2024-10-14 | Disposition: A | Discharge status: Home / Self Care | Payer: Self-pay | Source: Ambulatory Visit | Attending: Certified Nurse Midwife | Admitting: Certified Nurse Midwife

## 2024-10-14 ENCOUNTER — Ambulatory Visit
Admission: RE | Admit: 2024-10-14 | Discharge: 2024-10-14 | Disposition: A | Discharge status: Home / Self Care | Source: Ambulatory Visit | Attending: Certified Nurse Midwife | Admitting: Certified Nurse Midwife

## 2024-10-14 ENCOUNTER — Other Ambulatory Visit: Payer: Self-pay

## 2024-10-14 DIAGNOSIS — N6310 Unspecified lump in the right breast, unspecified quadrant: Secondary | ICD-10-CM

## 2024-10-14 DIAGNOSIS — N6321 Unspecified lump in the left breast, upper outer quadrant: Secondary | ICD-10-CM

## 2024-10-15 ENCOUNTER — Ambulatory Visit: Payer: Self-pay | Admitting: *Deleted

## 2024-10-15 VITALS — BP 116/85 | Wt 168.0 lb

## 2024-10-15 DIAGNOSIS — Z1239 Encounter for other screening for malignant neoplasm of breast: Secondary | ICD-10-CM

## 2024-10-15 DIAGNOSIS — Z1211 Encounter for screening for malignant neoplasm of colon: Secondary | ICD-10-CM

## 2024-10-15 DIAGNOSIS — N6315 Unspecified lump in the right breast, overlapping quadrants: Secondary | ICD-10-CM

## 2024-10-15 DIAGNOSIS — N6323 Unspecified lump in the left breast, lower outer quadrant: Secondary | ICD-10-CM

## 2024-10-15 DIAGNOSIS — N6452 Nipple discharge: Secondary | ICD-10-CM

## 2024-10-15 NOTE — Patient Instructions (Addendum)
 Explained breast self awareness with Chinita Crete. Patient did not need a Pap smear today due to patient has a history of a hysterectomy for benign reasons. Let her know that she doesn't need any further Pap smears due to her history of a hysterectomy for being reasons. Referred patient to [facility] for a left breast biopsy per recommendation. Appointment scheduled Tuesday, October 20, 2024 at 1245. Patient aware of appointment and will be there. Let patient know will follow up with her within the next couple weeks with results of her breast discharge by phone. Malley Hauter verbalized understanding.  Yuette Putnam, Wanda Ship, RN 11:43 AM

## 2024-10-15 NOTE — Progress Notes (Addendum)
 Tammy Pittman is a 48 y.o. female who presents to [facility] clinic today with complaint of bilateral breast lumps and discharge x 5 years. Patient stated the breast discharge is clear to milky when expresses. Patient referred to [facility] due to patient had a diagnostic mammogram completed 10/14/2024 that a left breast biopsy is recommended for follow up.   Pap Smear: Pap smear not completed today. Last Pap smear was 11/03/2019 at [facility] in [place] clinic and was abnormal - LSIL. Per patient has no history of an abnormal Pap smear prior to her most recent Pap smear. Patient has a history of a hysterectomy 01/19/2020 due to fibroids and AUB. Patient doesn't need any further Pap smears due to her history of a hysterectomy for benign reasons per BCCCP and ASCCP guidelines.. Last Pap smear result is available in Epic.   Physical exam: Breasts Breasts symmetrical. No skin abnormalities bilateral breasts. No nipple retraction bilateral breasts. Expressed a thick white discharge from the right breast and a clear discharge from the left breast on exam. Sample of bilateral breast discharge sent to Cytology for evaluation. No lymphadenopathy. Palpated a mobile lump within the right breast at 9 o'clock 7 cm from the nipple. Palpated a lump within the left breast at 4 o'clock next to the nipple. No complaints of pain or tenderness on exam.  MM 3D DIAGNOSTIC MAMMOGRAM BILATERAL BREAST Result Date: 10/14/2024 CLINICAL DATA:  Interval follow-up of a likely benign mass involving the UPPER OUTER QUADRANT of the RIGHT breast at 9:30 o'clock 7 cm from the nipple at posterior depth. Possible enlarging palpable lump in the outer subareolar LEFT breast. EXAM: DIGITAL DIAGNOSTIC BILATERAL MAMMOGRAM WITH TOMOSYNTHESIS AND CAD; ULTRASOUND LEFT BREAST LIMITED; ULTRASOUND RIGHT BREAST LIMITED TECHNIQUE: Bilateral digital diagnostic mammography and breast tomosynthesis was performed. The images  were evaluated with computer-aided detection. ; Targeted ultrasound examination of the left breast was performed.; Targeted ultrasound examination of the right breast was performed COMPARISON:  Previous exams, most recently diagnostic mammography and ultrasound 11/09/2019. No interval imaging. ACR Breast Density Category b: There are scattered areas of fibroglandular density. FINDINGS: Full field CC and MLO views of both breasts and a spot tangential view of the palpable concern in the LEFT breast were obtained. RIGHT: Partially obscured isodense mass whose visible margins are circumscribed in the UPPER OUTER QUADRANT at posterior depth, measuring just over 1 cm in size, unchanged since the December, 2020 mammogram. No associated architectural distortion or suspicious calcifications. No new or suspicious findings elsewhere. Targeted ultrasound is performed, again demonstrating the circumscribed oval parallel hypoechoic mass at 9:30 o'clock 7 cm from the nipple at posterior depth measuring approximately 1.3 x 0.4 x 1.0 cm (previously 1.1 x 0.4 x 1.1 cm on 11/09/2019), demonstrating mixed posterior characteristics and no internal power Doppler flow. LEFT: Corresponding to the palpable concern in the outer subareolar location is a partially obscured circumscribed isodense mass which likely corresponds to the cyst identified on the 11/09/2019 ultrasound, measuring on the order of 1 cm in size, unchanged. No associated architectural distortion or suspicious calcifications. New circumscribed isodense mass in the UPPER INNER QUADRANT at middle depth measuring just over 1 cm in size. No associated architectural distortion or suspicious calcifications. New partially obscured circumscribed isodense mass in the outer breast at middle depth, likely LOWER OUTER QUADRANT, measuring just under 1 cm in size. No associated architectural distortion or suspicious calcifications. Targeted ultrasound is performed, demonstrating an  anechoic mass with internal echoes at 1  o'clock subareolar location in the area of palpable concern, measuring approximately 0.9 x 0.4 x 0.6 cm, demonstrating posterior acoustic enhancement and no internal power Doppler flow. At 3 o'clock 5 cm from the nipple is an oval circumscribed parallel anechoic mass with scattered internal echoes measuring approximately 0.9 x 0.3 x 0.7 cm, demonstrating posterior acoustic enhancement and no internal power Doppler flow; there is power Doppler artifact within the lesion due to the debris. This corresponds to the new mammographic mass in the outer breast. At 10 o'clock 7 cm from the nipple at anterior to middle depth is an oval parallel circumscribed hypoechoic mass with a single lobulation measuring approximately 1.0 x 0.4 x 0.9 cm, demonstrating slight posterior acoustic enhancement and no internal power Doppler flow, corresponding to the new mammographic mass in the UPPER INNER QUADRANT. IMPRESSION: 1. New indeterminate 1.0 cm solid mass in the UPPER INNER QUADRANT of the LEFT breast at 10 o'clock 7 cm from nipple. 2. New likely benign 0.9 cm complicated cyst in the outer LEFT breast at 3 o'clock 5 cm from the nipple. 3. Benign 0.9 cm complicated cyst in the upper outer subareolar LEFT breast which accounts for the palpable concern. 4. No mammographic or sonographic evidence of malignancy involving the RIGHT breast. 5. Stable benign 1.3 cm mass in the UPPER OUTER QUADRANT of the RIGHT breast dating back to 2020, likely a fibroadenoma. RECOMMENDATION: 1. Ultrasound-guided core needle biopsy of the new solid mass in the UPPER INNER QUADRANT of the LEFT breast. 2. Aspiration of the complicated cyst in the upper outer subareolar LEFT breast for symptomatic relief. I have discussed the findings and recommendations with the patient and her husband. If applicable, a reminder letter will be sent to the patient regarding the next appointment. BI-RADS CATEGORY  4: Suspicious.  Electronically Signed   By: Debby Satterfield M.D.   On: 10/14/2024 15:55   MM DIAG BREAST TOMO BILATERAL Result Date: 11/09/2019 CLINICAL DATA:  Patient presents for palpable abnormality within the anterior left breast. Patient also reports a possible palpable abnormality, physician detected, within the inferior left breast. EXAM: DIGITAL DIAGNOSTIC BILATERAL MAMMOGRAM WITH CAD AND TOMO ULTRASOUND BILATERAL BREAST COMPARISON:  None ACR Breast Density Category b: There are scattered areas of fibroglandular density. FINDINGS: Underlying the skin within the anterior left breast is a small oval circumscribed mass. No additional masses identified within the left breast. Within the outer right breast there is a small oval circumscribed mass. No additional concerning masses within the outer right breast. Mammographic images were processed with CAD. Targeted ultrasound is performed, showing a 1.1 x 0.4 x 1.1 cm oval circumscribed hypoechoic mass right breast 9:30 o'clock 7 cm from nipple. Within the left breast 3 o'clock position retroareolar location of the site of palpable abnormality there is a 0.9 x 0.3 x 1.0 cm cyst. No suspicious abnormality within the left breast 4-6 o'clock position at the additional palpable site. IMPRESSION: 1. Probably benign right breast mass 9:30 o'clock favored to represent a fibroadenoma. 2. Benign left breast cyst corresponding with palpable abnormality. RECOMMENDATION: Right breast diagnostic mammogram and ultrasound in 6 months to ensure stability of probably benign right breast mass. I have discussed the findings and recommendations with the patient. If applicable, a reminder letter will be sent to the patient regarding the next appointment. BI-RADS CATEGORY  3: Probably benign. Electronically Signed   By: Bard Moats M.D.   On: 11/09/2019 09:29      Pelvic/Bimanual Pap is not indicated today per  BCCCP guidelines.   Smoking History: Patient has never smoked.   Patient  Navigation: Patient education provided. Access to services provided for patient through B[facility]CP program.   Colorectal Cancer Screening: Per patient has never had colonoscopy completed. FIT Test given to patient to complete. No complaints today.    Breast and Cervical Cancer Risk Assessment: Patient does not have family history of breast cancer, known genetic mutations, or radiation treatment to the chest before age 66. Patient does not have history of cervical dysplasia, immunocompromised, or DES exposure in-utero.  Risk Scores as of Encounter on 10/15/2024     Alisa           5-year 0.88%   Lifetime 9.28%            Last calculated by Silas, Ansyi K, CMA on 10/15/2024 at 11:39 AM        A: B[facility]CP exam without pap smear Complaint of bilateral breast lumps and discharge.  P: Referred patient to [facility] for a left breast biopsy per recommendation. Appointment scheduled Tuesday, October 20, 2024 at 1245.  Driscilla Wanda SQUIBB, RN 10/15/2024 11:43 AM

## 2024-10-16 ENCOUNTER — Other Ambulatory Visit (HOSPITAL_COMMUNITY)
Admission: RE | Admit: 2024-10-16 | Discharge: 2024-10-16 | Disposition: A | Discharge status: Home / Self Care | Payer: Self-pay | Source: Ambulatory Visit | Attending: Obstetrics and Gynecology | Admitting: Obstetrics and Gynecology

## 2024-10-16 DIAGNOSIS — N6452 Nipple discharge: Secondary | ICD-10-CM | POA: Insufficient documentation

## 2024-10-16 NOTE — Addendum Note (Signed)
 Addended by: LOGAN LYLE BRAVO on: 10/16/2024 08:42 AM   Modules accepted: Orders

## 2024-10-19 LAB — CYTOLOGY - NON PAP

## 2024-10-20 ENCOUNTER — Other Ambulatory Visit

## 2024-10-20 ENCOUNTER — Ambulatory Visit
Admission: RE | Admit: 2024-10-20 | Discharge: 2024-10-20 | Disposition: A | Source: Ambulatory Visit | Attending: Obstetrics and Gynecology | Admitting: Obstetrics and Gynecology

## 2024-10-20 ENCOUNTER — Ambulatory Visit: Payer: Self-pay

## 2024-10-20 ENCOUNTER — Ambulatory Visit
Admission: RE | Admit: 2024-10-20 | Discharge: 2024-10-20 | Disposition: A | Source: Ambulatory Visit | Attending: Certified Nurse Midwife | Admitting: Certified Nurse Midwife

## 2024-10-20 ENCOUNTER — Ambulatory Visit
Admission: RE | Admit: 2024-10-20 | Discharge: 2024-10-20 | Disposition: A | Discharge status: Home / Self Care | Source: Ambulatory Visit | Attending: Obstetrics and Gynecology | Admitting: Obstetrics and Gynecology

## 2024-10-20 DIAGNOSIS — N6321 Unspecified lump in the left breast, upper outer quadrant: Secondary | ICD-10-CM

## 2024-10-20 HISTORY — PX: BREAST BIOPSY: SHX20

## 2024-10-20 NOTE — Telephone Encounter (Signed)
 Called patient to give breast discharge results. Informed patient that the sample that we got in clinic was negative and that no malignant cells were identified. Patient is scheduled for a bx this afternoon and those results will give the final result. Patient voiced understanding.

## 2024-10-21 LAB — SURGICAL PATHOLOGY

## 2024-10-22 LAB — FECAL OCCULT BLOOD, IMMUNOCHEMICAL: Fecal Occult Bld: NEGATIVE
